# Patient Record
Sex: Male | Born: 1950
Health system: Southern US, Community
[De-identification: ages and names within clinical notes are randomized; demographics above are authoritative.]

## PROBLEM LIST (undated history)

## (undated) DIAGNOSIS — T7840XA Allergy, unspecified, initial encounter: Secondary | ICD-10-CM

## (undated) DIAGNOSIS — E785 Hyperlipidemia, unspecified: Secondary | ICD-10-CM

## (undated) DIAGNOSIS — M810 Age-related osteoporosis without current pathological fracture: Secondary | ICD-10-CM

## (undated) DIAGNOSIS — J45909 Unspecified asthma, uncomplicated: Secondary | ICD-10-CM

## (undated) DIAGNOSIS — K5790 Diverticulosis of intestine, part unspecified, without perforation or abscess without bleeding: Secondary | ICD-10-CM

## (undated) DIAGNOSIS — I10 Essential (primary) hypertension: Secondary | ICD-10-CM

## (undated) DIAGNOSIS — M1990 Unspecified osteoarthritis, unspecified site: Secondary | ICD-10-CM

## (undated) DIAGNOSIS — R972 Elevated prostate specific antigen [PSA]: Secondary | ICD-10-CM

## (undated) DIAGNOSIS — M5030 Other cervical disc degeneration, unspecified cervical region: Secondary | ICD-10-CM

## (undated) HISTORY — DX: Other cervical disc degeneration, unspecified cervical region: M50.30

## (undated) HISTORY — PX: NASAL SEPTUM SURGERY: SHX37

## (undated) HISTORY — DX: Elevated prostate specific antigen (PSA): R97.20

## (undated) HISTORY — DX: Allergy, unspecified, initial encounter: T78.40XA

## (undated) HISTORY — DX: Age-related osteoporosis without current pathological fracture: M81.0

## (undated) HISTORY — DX: Essential (primary) hypertension: I10

## (undated) HISTORY — DX: Unspecified asthma, uncomplicated: J45.909

## (undated) HISTORY — DX: Unspecified osteoarthritis, unspecified site: M19.90

## (undated) HISTORY — DX: Hyperlipidemia, unspecified: E78.5

## (undated) HISTORY — DX: Diverticulosis of intestine, part unspecified, without perforation or abscess without bleeding: K57.90

---

## 2001-03-21 ENCOUNTER — Emergency Department (HOSPITAL_COMMUNITY): Admission: EM | Admit: 2001-03-21 | Discharge: 2001-03-21 | Payer: Self-pay

## 2001-05-25 ENCOUNTER — Ambulatory Visit (HOSPITAL_COMMUNITY): Admission: RE | Admit: 2001-05-25 | Discharge: 2001-05-25 | Payer: Self-pay | Admitting: Internal Medicine

## 2004-08-05 ENCOUNTER — Ambulatory Visit: Payer: Self-pay | Admitting: Internal Medicine

## 2005-05-23 ENCOUNTER — Ambulatory Visit: Payer: Self-pay | Admitting: Gastroenterology

## 2005-05-23 ENCOUNTER — Ambulatory Visit: Payer: Self-pay | Admitting: Internal Medicine

## 2005-05-29 ENCOUNTER — Ambulatory Visit: Payer: Self-pay | Admitting: Internal Medicine

## 2005-06-02 ENCOUNTER — Ambulatory Visit: Payer: Self-pay | Admitting: Gastroenterology

## 2005-06-02 LAB — HM COLONOSCOPY: HM Colonoscopy: NORMAL

## 2005-08-26 ENCOUNTER — Ambulatory Visit: Payer: Self-pay | Admitting: Internal Medicine

## 2005-09-22 ENCOUNTER — Ambulatory Visit: Payer: Self-pay | Admitting: Internal Medicine

## 2006-06-12 ENCOUNTER — Ambulatory Visit: Payer: Self-pay | Admitting: Internal Medicine

## 2007-06-30 DIAGNOSIS — E785 Hyperlipidemia, unspecified: Secondary | ICD-10-CM

## 2007-06-30 DIAGNOSIS — I10 Essential (primary) hypertension: Secondary | ICD-10-CM

## 2007-06-30 HISTORY — DX: Hyperlipidemia, unspecified: E78.5

## 2007-06-30 HISTORY — DX: Essential (primary) hypertension: I10

## 2008-04-03 ENCOUNTER — Telehealth: Payer: Self-pay | Admitting: Internal Medicine

## 2008-04-13 ENCOUNTER — Encounter: Payer: Self-pay | Admitting: Internal Medicine

## 2008-04-13 ENCOUNTER — Telehealth: Payer: Self-pay | Admitting: Internal Medicine

## 2008-04-17 ENCOUNTER — Encounter: Payer: Self-pay | Admitting: Internal Medicine

## 2008-04-25 ENCOUNTER — Ambulatory Visit: Payer: Self-pay | Admitting: Internal Medicine

## 2008-07-17 ENCOUNTER — Telehealth: Payer: Self-pay | Admitting: Internal Medicine

## 2008-08-14 ENCOUNTER — Ambulatory Visit: Payer: Self-pay | Admitting: Internal Medicine

## 2008-08-14 DIAGNOSIS — J449 Chronic obstructive pulmonary disease, unspecified: Secondary | ICD-10-CM | POA: Insufficient documentation

## 2008-08-14 DIAGNOSIS — J4489 Other specified chronic obstructive pulmonary disease: Secondary | ICD-10-CM | POA: Insufficient documentation

## 2009-05-08 ENCOUNTER — Telehealth: Payer: Self-pay | Admitting: *Deleted

## 2009-07-06 ENCOUNTER — Ambulatory Visit: Payer: Self-pay | Admitting: Internal Medicine

## 2009-07-06 LAB — CONVERTED CEMR LAB
ALT: 29 units/L (ref 0–53)
AST: 28 units/L (ref 0–37)
Albumin: 4.6 g/dL (ref 3.5–5.2)
Alkaline Phosphatase: 60 units/L (ref 39–117)
BUN: 19 mg/dL (ref 6–23)
Basophils Absolute: 0 10*3/uL (ref 0.0–0.1)
Basophils Relative: 0 % (ref 0.0–3.0)
Bilirubin Urine: NEGATIVE
Bilirubin, Direct: 0 mg/dL (ref 0.0–0.3)
CO2: 27 meq/L (ref 19–32)
Calcium: 9.1 mg/dL (ref 8.4–10.5)
Chloride: 107 meq/L (ref 96–112)
Cholesterol: 134 mg/dL (ref 0–200)
Creatinine, Ser: 0.9 mg/dL (ref 0.4–1.5)
Direct LDL: 68.3 mg/dL
Eosinophils Absolute: 0.3 10*3/uL (ref 0.0–0.7)
Eosinophils Relative: 4.6 % (ref 0.0–5.0)
GFR calc non Af Amer: 91.44 mL/min (ref >60)
Glucose, Bld: 97 mg/dL (ref 70–99)
Glucose, Urine, Semiquant: NEGATIVE
HCT: 44.9 % (ref 39.0–52.0)
HDL: 53.4 mg/dL (ref >39.00)
Hemoglobin: 15.5 g/dL (ref 13.0–17.0)
Ketones, urine, test strip: NEGATIVE
Lymphocytes Relative: 31.7 % (ref 12.0–46.0)
Lymphs Abs: 1.9 10*3/uL (ref 0.7–4.0)
MCHC: 34.5 g/dL (ref 30.0–36.0)
MCV: 93.5 fL (ref 78.0–100.0)
Monocytes Absolute: 0.5 10*3/uL (ref 0.1–1.0)
Monocytes Relative: 9.2 % (ref 3.0–12.0)
Neutro Abs: 3.2 10*3/uL (ref 1.4–7.7)
Neutrophils Relative %: 54.5 % (ref 43.0–77.0)
Nitrite: NEGATIVE
PSA: 2.57 ng/mL (ref 0.10–4.00)
Platelets: 225 10*3/uL (ref 150.0–400.0)
Potassium: 4.2 meq/L (ref 3.5–5.1)
RBC: 4.8 M/uL (ref 4.22–5.81)
RDW: 12.5 % (ref 11.5–14.6)
Sodium: 139 meq/L (ref 135–145)
Specific Gravity, Urine: 1.02
TSH: 2.57 microintl units/mL (ref 0.35–5.50)
Total Bilirubin: 0.7 mg/dL (ref 0.3–1.2)
Total Protein: 7.1 g/dL (ref 6.0–8.3)
Urobilinogen, UA: 0.2
WBC: 5.9 10*3/uL (ref 4.5–10.5)
pH: 5.5

## 2009-07-16 ENCOUNTER — Ambulatory Visit: Payer: Self-pay | Admitting: Internal Medicine

## 2009-07-16 DIAGNOSIS — R972 Elevated prostate specific antigen [PSA]: Secondary | ICD-10-CM

## 2009-07-16 HISTORY — DX: Elevated prostate specific antigen (PSA): R97.20

## 2009-09-03 ENCOUNTER — Telehealth: Payer: Self-pay | Admitting: Internal Medicine

## 2009-10-04 ENCOUNTER — Ambulatory Visit: Payer: Self-pay | Admitting: Internal Medicine

## 2009-10-04 DIAGNOSIS — M810 Age-related osteoporosis without current pathological fracture: Secondary | ICD-10-CM

## 2009-10-04 DIAGNOSIS — M5412 Radiculopathy, cervical region: Secondary | ICD-10-CM | POA: Insufficient documentation

## 2009-10-04 DIAGNOSIS — M1990 Unspecified osteoarthritis, unspecified site: Secondary | ICD-10-CM | POA: Insufficient documentation

## 2009-10-04 HISTORY — DX: Unspecified osteoarthritis, unspecified site: M19.90

## 2009-10-04 HISTORY — DX: Age-related osteoporosis without current pathological fracture: M81.0

## 2009-10-04 LAB — CONVERTED CEMR LAB
CRP, High Sensitivity: 2.4 (ref 0.00–5.00)
Sed Rate: 10 mm/hr (ref 0–22)

## 2009-10-08 LAB — CONVERTED CEMR LAB
Anti Nuclear Antibody(ANA): NEGATIVE
C-Peptide: 1.24 ng/mL (ref 0.80–3.90)
PSA, Free Pct: 15 — ABNORMAL LOW (ref >25)
PSA, Free: 0.5 ng/mL
PSA: 3.31 ng/mL (ref 0.10–4.00)

## 2009-10-29 ENCOUNTER — Encounter: Payer: Self-pay | Admitting: Internal Medicine

## 2009-11-29 ENCOUNTER — Encounter: Payer: Self-pay | Admitting: Internal Medicine

## 2010-01-04 ENCOUNTER — Encounter: Payer: Self-pay | Admitting: Internal Medicine

## 2010-01-28 ENCOUNTER — Ambulatory Visit: Payer: Self-pay | Admitting: Internal Medicine

## 2010-01-28 LAB — CONVERTED CEMR LAB
ALT: 29 units/L (ref 0–53)
AST: 30 units/L (ref 0–37)
Albumin: 4.6 g/dL (ref 3.5–5.2)
Alkaline Phosphatase: 55 units/L (ref 39–117)
Bilirubin, Direct: 0.1 mg/dL (ref 0.0–0.3)
Cholesterol: 176 mg/dL (ref 0–200)
HDL: 63.3 mg/dL (ref >39.00)
LDL Cholesterol: 98 mg/dL (ref 0–99)
PSA, Free Pct: 15 — ABNORMAL LOW (ref >25)
PSA, Free: 0.5 ng/mL
PSA: 3.42 ng/mL (ref 0.10–4.00)
Total Bilirubin: 1.2 mg/dL (ref 0.3–1.2)
Total CHOL/HDL Ratio: 3
Total Protein: 7.2 g/dL (ref 6.0–8.3)
Triglycerides: 75 mg/dL (ref 0.0–149.0)
VLDL: 15 mg/dL (ref 0.0–40.0)

## 2010-02-04 ENCOUNTER — Ambulatory Visit: Payer: Self-pay | Admitting: Internal Medicine

## 2010-03-21 ENCOUNTER — Ambulatory Visit: Payer: Self-pay | Admitting: Internal Medicine

## 2010-03-21 DIAGNOSIS — L919 Hypertrophic disorder of the skin, unspecified: Secondary | ICD-10-CM

## 2010-03-21 DIAGNOSIS — L909 Atrophic disorder of skin, unspecified: Secondary | ICD-10-CM | POA: Insufficient documentation

## 2010-05-10 ENCOUNTER — Ambulatory Visit: Payer: Self-pay | Admitting: Internal Medicine

## 2010-05-10 LAB — CONVERTED CEMR LAB: PSA: 2.72 ng/mL (ref 0.10–4.00)

## 2010-05-16 ENCOUNTER — Telehealth: Payer: Self-pay | Admitting: Internal Medicine

## 2010-08-23 ENCOUNTER — Telehealth: Payer: Self-pay | Admitting: Internal Medicine

## 2010-08-23 ENCOUNTER — Ambulatory Visit: Payer: Self-pay | Admitting: Internal Medicine

## 2010-08-23 LAB — CONVERTED CEMR LAB
ALT: 26 units/L (ref 0–53)
AST: 25 units/L (ref 0–37)
Albumin: 4.7 g/dL (ref 3.5–5.2)
Alkaline Phosphatase: 62 units/L (ref 39–117)
BUN: 15 mg/dL (ref 6–23)
Basophils Absolute: 0 10*3/uL (ref 0.0–0.1)
Basophils Relative: 0.6 % (ref 0.0–3.0)
Bilirubin Urine: NEGATIVE
Bilirubin, Direct: 0.2 mg/dL (ref 0.0–0.3)
Blood in Urine, dipstick: NEGATIVE
CO2: 30 meq/L (ref 19–32)
Calcium: 9.5 mg/dL (ref 8.4–10.5)
Chloride: 100 meq/L (ref 96–112)
Cholesterol: 165 mg/dL (ref 0–200)
Creatinine, Ser: 0.8 mg/dL (ref 0.4–1.5)
Eosinophils Absolute: 0.3 10*3/uL (ref 0.0–0.7)
Eosinophils Relative: 4.7 % (ref 0.0–5.0)
GFR calc non Af Amer: 98.65 mL/min (ref >60)
Glucose, Bld: 98 mg/dL (ref 70–99)
Glucose, Urine, Semiquant: NEGATIVE
HCT: 46.8 % (ref 39.0–52.0)
HDL: 61.2 mg/dL (ref >39.00)
Hemoglobin: 16.1 g/dL (ref 13.0–17.0)
Ketones, urine, test strip: NEGATIVE
LDL Cholesterol: 96 mg/dL (ref 0–99)
Lymphocytes Relative: 34.9 % (ref 12.0–46.0)
Lymphs Abs: 2.2 10*3/uL (ref 0.7–4.0)
MCHC: 34.3 g/dL (ref 30.0–36.0)
MCV: 94.3 fL (ref 78.0–100.0)
Monocytes Absolute: 0.7 10*3/uL (ref 0.1–1.0)
Monocytes Relative: 10.6 % (ref 3.0–12.0)
Neutro Abs: 3.1 10*3/uL (ref 1.4–7.7)
Neutrophils Relative %: 49.2 % (ref 43.0–77.0)
Nitrite: NEGATIVE
PSA: 2.66 ng/mL (ref 0.10–4.00)
Platelets: 265 10*3/uL (ref 150.0–400.0)
Potassium: 4.6 meq/L (ref 3.5–5.1)
Protein, U semiquant: NEGATIVE
RBC: 4.96 M/uL (ref 4.22–5.81)
RDW: 13.6 % (ref 11.5–14.6)
Sodium: 139 meq/L (ref 135–145)
Specific Gravity, Urine: 1.01
TSH: 2.71 microintl units/mL (ref 0.35–5.50)
Total Bilirubin: 0.8 mg/dL (ref 0.3–1.2)
Total CHOL/HDL Ratio: 3
Total Protein: 7 g/dL (ref 6.0–8.3)
Triglycerides: 40 mg/dL (ref 0.0–149.0)
Urobilinogen, UA: 0.2
VLDL: 8 mg/dL (ref 0.0–40.0)
WBC Urine, dipstick: NEGATIVE
WBC: 6.4 10*3/uL (ref 4.5–10.5)
pH: 5.5

## 2010-09-09 ENCOUNTER — Encounter: Payer: Self-pay | Admitting: Internal Medicine

## 2010-09-12 ENCOUNTER — Ambulatory Visit: Payer: Self-pay | Admitting: Internal Medicine

## 2010-10-18 ENCOUNTER — Ambulatory Visit
Admission: RE | Admit: 2010-10-18 | Discharge: 2010-10-18 | Payer: Self-pay | Source: Home / Self Care | Attending: Internal Medicine | Admitting: Internal Medicine

## 2010-10-27 ENCOUNTER — Encounter: Payer: Self-pay | Admitting: Internal Medicine

## 2010-11-05 NOTE — Progress Notes (Signed)
Summary: Patient requesting letter  Phone Note Call from Patient   Complaint: Breathing Problems, Urinary/GYN Problems Summary of Call: Patient requesting a letter stating his various medications and what they are prescribed for. Patient states he needs letter this week. Patient can be reached at [phone]. Initial call taken by: Darra Lis RMA,  May 08, 2009 9:07 AM  Follow-up for Phone Call        done Follow-up by: Willy Eddy, LPN,  May 08, 2009 10:47 AM

## 2010-11-05 NOTE — Letter (Signed)
Summary: [facility]  [facility]   Imported By: Maryln Gottron 09/12/2010 11:19:44  _____________________________________________________________________  External Attachment:    Type:   Image     Comment:   External Document

## 2010-11-05 NOTE — Progress Notes (Signed)
Summary: cough  Phone Note Refill Request   Caller: Patient Call For: Dr. Lovell Sheehan Summary of Call: Pt is coughing x 3 days from his seasonal allergies.  No SOB. Thinks a cough med with codeine would help.  No fever. No other complaints. [phone] Initial call taken by: Lynann Beaver CMA,  July 17, 2008 8:57 AM  Follow-up for Phone Call        call in hydromet on his med list already.....   Follow-up by: Stacie Glaze MD,  July 17, 2008 2:44 PM    New/Updated Medications: HYDROMET 5-1.5 MG/5ML SYRP (HYDROCODONE-HOMATROPINE) two tsp by mouth q 8 hours   Prescriptions: HYDROMET 5-1.5 MG/5ML SYRP (HYDROCODONE-HOMATROPINE) two tsp by mouth q 8 hours  #6 oz. x 0   Entered by:   Lynann Beaver CMA   Authorized by:   Stacie Glaze MD   Signed by:   Lynann Beaver CMA on 07/17/2008   Method used:   Telephoned to ...       [facility]  [place] [address] (retail)       [address]       [place], [place]  [address]       Ph: [phone] or [phone]       Fax: [phone]   RxID:   [id number]

## 2010-11-05 NOTE — Progress Notes (Signed)
Summary: Fax lab order request  Phone Note Call from Patient Call back at [phone] (cell)   Caller: Patient Call For: Lovell Sheehan Reason for Call: Lab or Test Results Summary of Call: Pt has CPX scheduled for next week.  Pt will be in [place] until then and needs to have his labs done there Please fax order for CBC, Lipid panel, Liver panel and CMP to [phone] to pt attention Initial call taken by: Sid Falcon LPN,  April 13, 3473 2:53 PM  Follow-up for Phone Call        done Follow-up by: Willy Eddy, LPN,  April 13, 2594 3:12 PM

## 2010-11-05 NOTE — Progress Notes (Signed)
Summary: written and call in rx  Phone Note Call from Patient Call back at Home Phone [phone]   Caller: Patient Call For: dr Lovell Sheehan Summary of Call: pt would like mailorder rx for lotrel and vytorin also [facility] day [facility] of both meds. Initial call taken by: Heron Sabins,  April 03, 2008 11:22 AM      Prescriptions: VYTORIN 10-40 MG  TABS (EZETIMIBE-SIMVASTATIN) Take 1 tablet by mouth once a day  #30 x 0   Entered by:   Willy Eddy, LPN   Authorized by:   Stacie Glaze MD   Signed by:   Willy Eddy, LPN on 62/13/0865   Method used:   Electronically sent to ...       [facility]  [place] [address]       [address]       [place], [place]  [address]       Ph: [phone] or [phone]       Fax: [phone]   RxID:   [id number] LOTREL 5-10 MG  CAPS (AMLODIPINE BESY-BENAZEPRIL HCL)   #30 x 0   Entered by:   Willy Eddy, LPN   Authorized by:   Stacie Glaze MD   Signed by:   Willy Eddy, LPN on 88/41/6606   Method used:   Electronically sent to ...       [facility]  [place] [address]       [address]       [place], [place]  [address]       Ph: [phone] or [phone]       Fax: [phone]   RxID:   [id number]

## 2010-11-05 NOTE — Letter (Signed)
Summary: [facility]  [facility]   Imported By: Maryln Gottron 12/04/2009 10:02:54  _____________________________________________________________________  External Attachment:    Type:   Image     Comment:   External Document

## 2010-11-05 NOTE — Assessment & Plan Note (Signed)
Summary: CPX/RCD   Vital Signs:  Patient profile:   60 year old male Height:      69 inches Weight:      189 pounds BMI:     28.01 Temp:     98.2 degrees F oral Pulse rate:   84 / minute Resp:     12 per minute BP sitting:   140 / 80  (left arm)  Vitals Entered By: Willy Eddy, LPN (July 16, 2009 3:03 PM)  CC:  cpx.  History of Present Illness: The pt was asked about all immunizations, health maint. services that are appropriate to their age and was given guidance on diet exercize  and weight management slight increase in the PSA of about 25% over  18 months  Problems Prior to Update: 1)  Chronic Obstructive Asthma Unspecified  (ICD-493.20) 2)  Hypertension  (ICD-401.9) 3)  Hyperlipidemia  (ICD-272.4)  Medications Prior to Update: 1)  Lotrel 5-10 Mg  Caps (Amlodipine Besy-Benazepril Hcl) .Marland Kitchen.. 1 Once Daily- No More Without Ov 2)  Astelin 137 Mcg/spray  Soln (Azelastine Hcl) .... As Needed 3)  Glucosamine Hcl 1000 Mg  Tabs (Glucosamine Hcl) .... As Needed 4)  Vytorin 10-40 Mg  Tabs (Ezetimibe-Simvastatin) .... Take 1 Tablet By Mouth Once A Day 5)  Advair Diskus 250-50 Mcg/dose  Misc (Fluticasone-Salmeterol) .... As Needed 6)  Hydromet 5-1.5 Mg/54ml Syrp (Hydrocodone-Homatropine) .... Two Tsp By Mouth Q 8 Hours  Current Medications (verified): 1)  Lotrel 5-10 Mg  Caps (Amlodipine Besy-Benazepril Hcl) .Marland Kitchen.. 1 Once Daily- No More Without Ov 2)  Astelin 137 Mcg/spray  Soln (Azelastine Hcl) .... As Needed 3)  Glucosamine Hcl 1000 Mg  Tabs (Glucosamine Hcl) .... As Needed 4)  Vytorin 10-40 Mg  Tabs (Ezetimibe-Simvastatin) .... Take 1 Tablet By Mouth Once A Day 5)  Advair Diskus 250-50 Mcg/dose  Misc (Fluticasone-Salmeterol) .... As Needed 6)  Allegra 180 Mg Tabs (Fexofenadine Hcl) .Marland Kitchen.. 1 Once Daily  Allergies (verified): No Known Drug Allergies  Past History:  Past medical, surgical, family and social histories (including risk factors) reviewed, and no changes noted  (except as noted below).  Past Medical History: Reviewed history from 06/30/2007 and no changes required. Hyperlipidemia Hypertension  Family History: Reviewed history and no changes required.  Social History: Reviewed history and no changes required.  Review of Systems  The patient denies anorexia, fever, weight loss, weight gain, vision loss, decreased hearing, hoarseness, chest pain, syncope, dyspnea on exertion, peripheral edema, prolonged cough, headaches, hemoptysis, abdominal pain, melena, hematochezia, severe indigestion/heartburn, hematuria, incontinence, genital sores, muscle weakness, suspicious skin lesions, transient blindness, difficulty walking, depression, unusual weight change, abnormal bleeding, enlarged lymph nodes, angioedema, and breast masses.    Physical Exam  General:  Well-developed,well-nourished,in no acute distress; alert,appropriate and cooperative throughout examination Head:  normocephalic and male-pattern balding.   Eyes:  No corneal or conjunctival inflammation noted. EOMI. Perrla. Funduscopic exam benign, without hemorrhages, exudates or papilledema. Vision grossly normal. Ears:  R ear normal and L ear normal.   Nose:  External nasal examination shows no deformity or inflammation. Nasal mucosa are pink and moist without lesions or exudates. Mouth:  Oral mucosa and oropharynx without lesions or exudates.  Teeth in good repair. Neck:  No deformities, masses, or tenderness noted. Chest Wall:  No deformities, masses, tenderness or gynecomastia noted. Lungs:  Normal respiratory effort, chest expands symmetrically. Lungs are clear to auscultation, no crackles or wheezes. Heart:  Normal rate and regular rhythm. S1 and S2 normal without  gallop, murmur, click, rub or other extra sounds. Abdomen:  Bowel sounds positive,abdomen soft and non-tender without masses, organomegaly or hernias noted. Rectal:  No external abnormalities noted. Normal sphincter tone. No  rectal masses or tenderness. Prostate:  Prostate gland firm and smooth, no enlargement, nodularity, tenderness, mass, asymmetry or induration. Msk:  No deformity or scoliosis noted of thoracic or lumbar spine.   Pulses:  R and L carotid,radial,femoral,dorsalis pedis and posterior tibial pulses are full and equal bilaterally Extremities:  No clubbing, cyanosis, edema, or deformity noted with normal full range of motion of all joints.   Neurologic:  No cranial nerve deficits noted. Station and gait are normal. Plantar reflexes are down-going bilaterally. DTRs are symmetrical throughout. Sensory, motor and coordinative functions appear intact.   Impression & Recommendations:  Problem # 1:  PREVENTIVE HEALTH CARE (ICD-V70.0) The pt was asked about all immunizations, health maint. services that are appropriate to their age and was given guidance on diet exercize  and weight management  Colonoscopy: normal (05/27/2005) Td Booster: Td (10/06/2001)   Flu Vax: Fluvax 3+ (07/16/2009)   Chol: 134 (07/06/2009)   HDL: 53.40 (07/06/2009)   TSH: 2.57 (07/06/2009)   PSA: 2.57 (07/06/2009) Next Colonoscopy due:: 06/2015 (07/16/2009)  Discussed using sunscreen, use of alcohol, drug use, self testicular exam, routine dental care, routine eye care, routine physical exam, seat belts, multiple vitamins, osteoporosis prevention, adequate calcium intake in diet, and recommendations for immunizations.  Discussed exercise and checking cholesterol.  Discussed gun safety, safe sex, and contraception. Also recommend checking PSA.  Problem # 2:  CHRONIC OBSTRUCTIVE ASTHMA UNSPECIFIED (ICD-493.20) stable His updated medication list for this problem includes:    Advair Diskus 250-50 Mcg/dose Misc (Fluticasone-salmeterol) .Marland Kitchen... As needed  Problem # 3:  HYPERTENSION (ICD-401.9)  His updated medication list for this problem includes:    Lotrel 5-10 Mg Caps (Amlodipine besy-benazepril hcl) .Marland Kitchen... 1 once daily- no more without  ov  Orders: EKG w/ Interpretation (93000)  NSR  BP today: 140/80 Prior BP: 130/80 (08/14/2008)  Prior 10 Yr Risk Heart Disease: Not enough information (08/14/2008)  Labs Reviewed: K+: 4.2 (07/06/2009) Creat: : 0.9 (07/06/2009)   Chol: 134 (07/06/2009)   HDL: 53.40 (07/06/2009)     Complete Medication List: 1)  Lotrel 5-10 Mg Caps (Amlodipine besy-benazepril hcl) .Marland Kitchen.. 1 once daily- no more without ov 2)  Astelin 137 Mcg/spray Soln (Azelastine hcl) .... As needed 3)  Glucosamine Hcl 1000 Mg Tabs (Glucosamine hcl) .... As needed 4)  Vytorin 10-40 Mg Tabs (Ezetimibe-simvastatin) .... Take 1 tablet by mouth once a day 5)  Advair Diskus 250-50 Mcg/dose Misc (Fluticasone-salmeterol) .... As needed 6)  Allegra 180 Mg Tabs (Fexofenadine hcl) .Marland Kitchen.. 1 once daily  Other Orders: Admin 1st Vaccine ([address]) Flu Vaccine 53yrs + ([address]454)  Patient Instructions: 1)  PSA and free PSA in 6 months 790.93 2)  Hepatic Panel prior to visit, ICD-9:995.20 3)  Lipid Panel prior to visit, ICD-9:272.4 4)  Please schedule a follow-up appointment in 6 months.   Preventive Care Screening  Colonoscopy:    Date:  05/27/2005    Next Due:  06/2015    Results:  normal      Flu Vaccine Consent Questions     Do you have a history of severe allergic reactions to this vaccine? no    Any prior history of allergic reactions to egg and/or gelatin? no    Do you have a sensitivity to the preservative Thimersol? no    Do you have  a past history of Guillan-Barre Syndrome? no    Do you currently have an acute febrile illness? no    Have you ever had a severe reaction to latex? no    Vaccine information given and explained to patient? yes    Are you currently pregnant? no    Lot Number:[id number]   Exp Date:04/04/2010   Site Given  Left Deltoid IM       Appended Document: CPX/RCD scripts ready for pick up

## 2010-11-05 NOTE — Op Note (Signed)
Summary: Ultrasound and Biopsy of the Prostate/[facility]  Ultrasound and Biopsy of the Prostate/[facility]   Imported By: Maryln Gottron 01/11/2010 15:45:59  _____________________________________________________________________  External Attachment:    Type:   Image     Comment:   External Document

## 2010-11-05 NOTE — Progress Notes (Signed)
Summary: [facility] AND NEED AN APPT  Phone Note Call from Patient Call back at Home Phone [phone]   Caller: Patient-LIVE CALL Summary of Call: WAS GIVEN LOTREL, VYTORIN AND ADVAIR. PLEASE REWRITE DISPENSE AS WRITTEN FOR MAIL ORDER. CALL PT WHEN READY FOR PICK UP.  WANTS FUP APPT WITH DR Lovell Sheehan IN THE NEXT 2 WEEKS.  Initial call taken by: Warnell Forester,  September 03, 2009 1:03 PM    Prescriptions: ADVAIR DISKUS 250-50 MCG/DOSE  MISC (FLUTICASONE-SALMETEROL) as needed  #3 units x 3   Entered by:   Willy Eddy, LPN   Authorized by:   Stacie Glaze MD   Signed by:   Willy Eddy, LPN on 29/56/2130   Method used:   Print then Give to Patient   RxID:   [id number] VYTORIN 10-40 MG  TABS (EZETIMIBE-SIMVASTATIN) Take 1 tablet by mouth once a day  #90 x 3   Entered by:   Willy Eddy, LPN   Authorized by:   Stacie Glaze MD   Signed by:   Willy Eddy, LPN on 32/44/0102   Method used:   Print then Give to Patient   RxID:   [id number] LOTREL 5-10 MG  CAPS (AMLODIPINE BESY-BENAZEPRIL HCL) 1 once daily- no more without ov  #90 x 3   Entered by:   Willy Eddy, LPN   Authorized by:   Stacie Glaze MD   Signed by:   Willy Eddy, LPN on 56/38/7564   Method used:   Print then Give to Patient   RxID:   [id number]

## 2010-11-05 NOTE — Consult Note (Signed)
Summary: [facility]  [facility]   Imported By: Maryln Gottron 11/05/2009 11:21:38  _____________________________________________________________________  External Attachment:    Type:   Image     Comment:   External Document

## 2010-11-05 NOTE — Assessment & Plan Note (Signed)
Summary: Ian Burns/bmw   Vital Signs:  Patient profile:   60 year old male Height:      69 inches Weight:      188 pounds BMI:     27.86 Temp:     98.2 degrees F oral Pulse rate:   84 / minute Resp:     14 per minute BP sitting:   122 / 80  (left arm)  Vitals Entered By: Willy Eddy, LPN (October 04, 2009 8:38 AM) CC: Ian Burns   CC:  Ian Burns.  History of Present Illness: REVEIW OF PROSTATE ISSUES AND THE PSA FROM THE PHYSICAL THE PTS HAS SEVERE NECK PAIN WITH PROBABLE RADICULOPATY BUT DUE TO SEVERE CLOSTROPHOBIA WE HAVE BEEN UNABLE TO GET AN MRI we discussed the use of versed in a hospital setting for the mri the pt also noted increased arthristis, morning stiffness and low back pain that inmproves with activity the pt has mild swelling of hand, and report no warmth, or red ness of the joints  I have spent greater that 45 min face to face evaluating this patient and over 1/2 of this time was in councilling ( total time)  Problems Prior to Update: 1)  Prostate Specific Antigen, Elevated  (ICD-790.93) 2)  Preventive Health Care  (ICD-V70.0) 3)  Chronic Obstructive Asthma Unspecified  (ICD-493.20) 4)  Hypertension  (ICD-401.9) 5)  Hyperlipidemia  (ICD-272.4)  Medications Prior to Update: 1)  Lotrel 5-10 Mg  Caps (Amlodipine Besy-Benazepril Hcl) .Marland Kitchen.. 1 Once Daily- No More Without Ov 2)  Astelin 137 Mcg/spray  Soln (Azelastine Hcl) .... As Needed 3)  Glucosamine Hcl 1000 Mg  Tabs (Glucosamine Hcl) .... As Needed 4)  Vytorin 10-40 Mg  Tabs (Ezetimibe-Simvastatin) .... Take 1 Tablet By Mouth Once A Day 5)  Advair Diskus 250-50 Mcg/dose  Misc (Fluticasone-Salmeterol) .... As Needed 6)  Allegra 180 Mg Tabs (Fexofenadine Hcl) .Marland Kitchen.. 1 Once Daily  Current Medications (verified): 1)  Lotrel 5-10 Mg  Caps (Amlodipine Besy-Benazepril Hcl) .Marland Kitchen.. 1 Once Daily- No More Without Ov 2)  Astelin 137 Mcg/spray  Soln (Azelastine Hcl) .... As Needed 3)  Glucosamine Hcl 1000 Mg  Tabs (Glucosamine Hcl)  .... As Needed 4)  Vytorin 10-40 Mg  Tabs (Ezetimibe-Simvastatin) .... Take 1 Tablet By Mouth Once A Day 5)  Advair Diskus 250-50 Mcg/dose  Misc (Fluticasone-Salmeterol) .... As Needed 6)  Allegra 180 Mg Tabs (Fexofenadine Hcl) .Marland Kitchen.. 1 Once Daily  Allergies (verified): No Known Drug Allergies  Past History:  Past medical, surgical, family and social histories (including risk factors) reviewed, and no changes noted (except as noted below).  Past Medical History: Reviewed history from 06/30/2007 and no changes required. Hyperlipidemia Hypertension  Family History: Reviewed history and no changes required. no family hx of prostate cnacer  Social History: Reviewed history and no changes required.  Review of Systems  The patient denies anorexia, fever, weight loss, weight gain, vision loss, decreased hearing, hoarseness, chest pain, syncope, dyspnea on exertion, peripheral edema, prolonged cough, headaches, hemoptysis, abdominal pain, melena, hematochezia, severe indigestion/heartburn, hematuria, incontinence, genital sores, muscle weakness, suspicious skin lesions, transient blindness, difficulty walking, depression, unusual weight change, abnormal bleeding, enlarged lymph nodes, angioedema, breast masses, and testicular masses.    Physical Exam  General:  Well-developed,well-nourished,in no acute distress; alert,appropriate and cooperative throughout examination Head:  normocephalic and male-pattern balding.   Eyes:  pupils equal, pupils round, and pupils reactive to light.   Ears:  R ear normal and L ear normal.  R ear normal  and L ear normal.   Nose:  External nasal examination shows no deformity or inflammation. Nasal mucosa are pink and moist without lesions or exudates. Neck:  No deformities, masses, or tenderness noted. Lungs:  Normal respiratory effort, chest expands symmetrically. Lungs are clear to auscultation, no crackles or wheezes. Heart:  Normal rate and regular rhythm.  S1 and S2 normal without gallop, murmur, click, rub or other extra sounds. Abdomen:  Bowel sounds positive,abdomen soft and non-tender without masses, organomegaly or hernias noted. Msk:  No deformity or scoliosis noted of thoracic or lumbar spine.   Pulses:  R and L carotid,radial,femoral,dorsalis pedis and posterior tibial pulses are full and equal bilaterally Extremities:  No clubbing, cyanosis, edema, or deformity noted with normal full range of motion of all joints.   Neurologic:  No cranial nerve deficits noted. Station and gait are normal. Plantar reflexes are down-going bilaterally. DTRs are symmetrical throughout. Sensory, motor and coordinative functions appear intact. Skin:  turgor normal and color normal.   Cervical Nodes:  No lymphadenopathy noted Axillary Nodes:  No palpable lymphadenopathy Psych:  Oriented X3 and not anxious appearing.     Impression & Recommendations:  Problem # 1:  OSTEOARTHRITIS, MODERATE (ICD-715.90)  monitering labs Discussed use of medications, application of heat or cold, and exercises.   Orders: Venipuncture ([address]) TLB-CRP-High Sensitivity (C-Reactive Protein) (86140-FCRP) TLB-Sedimentation Rate (ESR) (85652-ESR) T-Antinuclear Antib (ANA) [phone]) T- * Misc. Laboratory test [phone])  Problem # 2:  PROSTATE SPECIFIC ANTIGEN, ELEVATED (ICD-790.93) discussed the BPH vs concern over cnacer hesitancy in the AM with decreased flow I have spent greater that 30 min face to face evaluating this patient over 1/2 was councilling  Problem # 3:  CHRONIC OBSTRUCTIVE ASTHMA UNSPECIFIED (ICD-493.20) peak flow was 400 reproducible and the use of the inhailer has been variable His updated medication list for this problem includes:    Advair Diskus 250-50 Mcg/dose Misc (Fluticasone-salmeterol) .Marland Kitchen..Marland Kitchen Two times a day I have spent greater that 30 min face to face evaluating this patient over 1/2 was councilling  Problem # 4:  HYPERTENSION  (ICD-401.9) stable His updated medication list for this problem includes:    Lotrel 5-10 Mg Caps (Amlodipine besy-benazepril hcl) .Marland Kitchen... 1 once daily- no more without ov  BP today: 122/80 Prior BP: 140/80 (07/16/2009)  Prior 10 Yr Risk Heart Disease: Not enough information (08/14/2008)  Labs Reviewed: K+: 4.2 (07/06/2009) Creat: : 0.9 (07/06/2009)   Chol: 134 (07/06/2009)   HDL: 53.40 (07/06/2009)     Complete Medication List: 1)  Lotrel 5-10 Mg Caps (Amlodipine besy-benazepril hcl) .Marland Kitchen.. 1 once daily- no more without ov 2)  Astelin 137 Mcg/spray Soln (Azelastine hcl) .... As needed 3)  Glucosamine Hcl 1000 Mg Tabs (Glucosamine hcl) .... As needed 4)  Vytorin 10-40 Mg Tabs (Ezetimibe-simvastatin) .... Take 1 tablet by mouth once a day 5)  Advair Diskus 250-50 Mcg/dose Misc (Fluticasone-salmeterol) .... Two times a day 6)  Allegra 180 Mg Tabs (Fexofenadine hcl) .Marland Kitchen.. 1 once daily  Other Orders: T-PSA Total ([address]) T-PSA Free ([address])  Patient Instructions: 1)  increased the advair to two times a day for the next 30 days  Appended Document: Orders Update    Clinical Lists Changes  Problems: Added new problem of CERVICAL RADICULOPATHY (ICD-723.4) Orders: Added new Referral order of Radiology Referral (Radiology) - Signed      Appended Document: Ian Burns/bmw     History of Present Illness: addendum to todays visit: Pt rides horses for sport ( dessage) and noted pain  radiating down next arm this was fisrt docummented in 2007 with plane films since then the pain has been intermintnantly severe ranging from a 3/10 to a 8/10 based on activity he notes numbness down the left arm asan afterthougt today he states that he is finnaly ready to find outwhat is causing this but due to his signficant claustraphobia he has refused MRIS in the past. Due to the nature of his symptoms and the lenght of the pain as wel as the suggestion of c4-5 dz on plain films an MRI is  warrented.  Allergies: No Known Drug Allergies   Impression & Recommendations:  Problem # 1:  CERVICAL RADICULOPATHY (ICD-723.4) mri of cervicla spine with versed due to claustraphobia  Complete Medication List: 1)  Lotrel 5-10 Mg Caps (Amlodipine besy-benazepril hcl) .Marland Kitchen.. 1 once daily- no more without ov 2)  Astelin 137 Mcg/spray Soln (Azelastine hcl) .... As needed 3)  Glucosamine Hcl 1000 Mg Tabs (Glucosamine hcl) .... As needed 4)  Vytorin 10-40 Mg Tabs (Ezetimibe-simvastatin) .... Take 1 tablet by mouth once a day 5)  Advair Diskus 250-50 Mcg/dose Misc (Fluticasone-salmeterol) .... Two times a day 6)  Allegra 180 Mg Tabs (Fexofenadine hcl) .Marland Kitchen.. 1 once daily

## 2010-11-05 NOTE — Progress Notes (Signed)
Summary: PSA  Phone Note Call from Patient   Caller: Patient Call For: Stacie Glaze MD Reason for Call: Acute Illness Summary of Call: Pt is asking for PSA results. [phone] Initial call taken by: Lynann Beaver CMA,  May 16, 2010 2:49 PM  Follow-up for Phone Call        left message on machine  Follow-up by: Willy Eddy, LPN,  May 17, 2010 8:54 AM

## 2010-11-05 NOTE — Assessment & Plan Note (Signed)
Summary: discuss bloodwork/mhf   Vital Signs:  Patient Profile:   60 Years Old Male Weight:      187 pounds Temp:     98.5 degrees F oral Pulse rate:   76 / minute Resp:     14 per minute BP sitting:   130 / 80  (left arm)  Vitals Entered By: Willy Eddy, LPN (August 14, 2008 4:05 PM)                 Chief Complaint:  roa.  History of Present Illness: Current Problems:  HYPERTENSION (ICD-401.9) HYPERLIPIDEMIA (ICD-272.4)    Hypertension Follow-Up      This is a 60 year old man who presents for Hypertension follow-up.  blood pressure at drug store 118/90.  The patient denies lightheadedness, urinary frequency, headaches, edema, impotence, rash, and fatigue.  The patient denies the following associated symptoms: chest pain, chest pressure, exercise intolerance, dyspnea, palpitations, syncope, leg edema, and pedal edema.  Compliance with medications (by patient report) has been near 100%.  The patient reports that dietary compliance has been excellent.  The patient reports exercising daily.    Hypertension History:      He denies headache, chest pain, palpitations, dyspnea with exertion, orthopnea, PND, peripheral edema, visual symptoms, neurologic problems, syncope, and side effects from treatment.        Positive major cardiovascular risk factors include male age 51 years old or older, hyperlipidemia, and hypertension.       Prior Medication List:  LOTREL 5-10 MG  CAPS (AMLODIPINE BESY-BENAZEPRIL HCL) 1 once daily- no more without ov ASTELIN 137 MCG/SPRAY  SOLN (AZELASTINE HCL) as needed GLUCOSAMINE HCL 1000 MG  TABS (GLUCOSAMINE HCL) as needed VYTORIN 10-40 MG  TABS (EZETIMIBE-SIMVASTATIN) Take 1 tablet by mouth once a day ADVAIR DISKUS 250-50 MCG/DOSE  MISC (FLUTICASONE-SALMETEROL) as needed HYDROMET 5-1.5 MG/5ML SYRP (HYDROCODONE-HOMATROPINE) two tsp by mouth q 8 hours   Current Allergies (reviewed today): No known allergies   Past Medical History:  Reviewed history from 06/30/2007 and no changes required:       Hyperlipidemia       Hypertension   Family History:    Reviewed history and no changes required:  Social History:    Reviewed history and no changes required:    Review of Systems  The patient denies anorexia, fever, weight loss, weight gain, vision loss, decreased hearing, hoarseness, chest pain, syncope, dyspnea on exertion, peripheral edema, prolonged cough, headaches, hemoptysis, abdominal pain, melena, hematochezia, severe indigestion/heartburn, hematuria, incontinence, genital sores, muscle weakness, suspicious skin lesions, transient blindness, difficulty walking, depression, unusual weight change, abnormal bleeding, enlarged lymph nodes, angioedema, and breast masses.     Physical Exam  General:     Well-developed,well-nourished,in no acute distress; alert,appropriate and cooperative throughout examination Eyes:     No corneal or conjunctival inflammation noted. EOMI. Perrla. Funduscopic exam benign, without hemorrhages, exudates or papilledema. Vision grossly normal. Nose:     External nasal examination shows no deformity or inflammation. Nasal mucosa are pink and moist without lesions or exudates. Mouth:     Oral mucosa and oropharynx without lesions or exudates.  Teeth in good repair. Neck:     No deformities, masses, or tenderness noted. Lungs:     Normal respiratory effort, chest expands symmetrically. Lungs are clear to auscultation, no crackles or wheezes. Heart:     Normal rate and regular rhythm. S1 and S2 normal without gallop, murmur, click, rub or other extra sounds. Abdomen:  Bowel sounds positive,abdomen soft and non-tender without masses, organomegaly or hernias noted. Rectal:     No external abnormalities noted. Normal sphincter tone. No rectal masses or tenderness. Prostate:     Prostate gland firm and smooth, no enlargement, nodularity, tenderness, mass, asymmetry or induration. Msk:      No deformity or scoliosis noted of thoracic or lumbar spine.   Pulses:     R and L carotid,radial,femoral,dorsalis pedis and posterior tibial pulses are full and equal bilaterally Neurologic:     No cranial nerve deficits noted. Station and gait are normal. Plantar reflexes are down-going bilaterally. DTRs are symmetrical throughout. Sensory, motor and coordinative functions appear intact.    Impression & Recommendations:  Problem # 1:  HYPERTENSION (ICD-401.9)  His updated medication list for this problem includes:    Lotrel 5-10 Mg Caps (Amlodipine besy-benazepril hcl) .Marland Kitchen... 1 once daily- no more without ov  BP today: 150/90   Problem # 2:  HYPERLIPIDEMIA (ICD-272.4)  His updated medication list for this problem includes:    Vytorin 10-40 Mg Tabs (Ezetimibe-simvastatin) .Marland Kitchen... Take 1 tablet by mouth once a day   Problem # 3:  HYPERLIPIDEMIA (ICD-272.4)  His updated medication list for this problem includes:    Vytorin 10-40 Mg Tabs (Ezetimibe-simvastatin) .Marland Kitchen... Take 1 tablet by mouth once a day   Problem # 4:  CHRONIC OBSTRUCTIVE ASTHMA UNSPECIFIED (ICD-493.20)  His updated medication list for this problem includes:    Advair Diskus 250-50 Mcg/dose Misc (Fluticasone-salmeterol) .Marland Kitchen... As needed   Complete Medication List: 1)  Lotrel 5-10 Mg Caps (Amlodipine besy-benazepril hcl) .Marland Kitchen.. 1 once daily- no more without ov 2)  Astelin 137 Mcg/spray Soln (Azelastine hcl) .... As needed 3)  Glucosamine Hcl 1000 Mg Tabs (Glucosamine hcl) .... As needed 4)  Vytorin 10-40 Mg Tabs (Ezetimibe-simvastatin) .... Take 1 tablet by mouth once a day 5)  Advair Diskus 250-50 Mcg/dose Misc (Fluticasone-salmeterol) .... As needed 6)  Hydromet 5-1.5 Mg/18ml Syrp (Hydrocodone-homatropine) .... Two tsp by mouth q 8 hours  Hypertension Assessment/Plan:      The patient's hypertensive risk group is category B: At least one risk factor (excluding diabetes) with no target organ damage.  Today's blood  pressure is 130/80.  His blood pressure goal is < 140/90.   Patient Instructions: 1)  PRN   Prescriptions: ADVAIR DISKUS 250-50 MCG/DOSE  MISC (FLUTICASONE-SALMETEROL) as needed  #3 units x 3   Entered and Authorized by:   Stacie Glaze MD   Signed by:   Stacie Glaze MD on 08/14/2008   Method used:   Print then Give to Patient   RxID:   [id number] VYTORIN 10-40 MG  TABS (EZETIMIBE-SIMVASTATIN) Take 1 tablet by mouth once a day  #90 x 3   Entered and Authorized by:   Stacie Glaze MD   Signed by:   Stacie Glaze MD on 08/14/2008   Method used:   Print then Give to Patient   RxID:   [id number] LOTREL 5-10 MG  CAPS (AMLODIPINE BESY-BENAZEPRIL HCL) 1 once daily- no more without ov  #90 x 3   Entered and Authorized by:   Stacie Glaze MD   Signed by:   Stacie Glaze MD on 08/14/2008   Method used:   Print then Give to Patient   RxID:   [id number]  ]

## 2010-11-05 NOTE — Progress Notes (Signed)
Summary: needs refills for locally and for mail order  Phone Note Refill Request Call back at Home Phone [phone] Message from:  Patient---walk in  Refills Requested: Medication #1:  LOTREL 5-10 MG  CAPS 1 once daily- no more without ov  Medication #2:  VYTORIN 10-40 MG  TABS Take 1 tablet by mouth once a day [facility] . also need these 2 written out for mail order. he will pick up scripts. please call him when ready.  Initial call taken by: Warnell Forester,  August 23, 2010 8:42 AM  Follow-up for Phone Call        please let pt know 30 days scripts sent to [facility] and written scripts are ready for pick up Follow-up by: Willy Eddy, LPN,  August 23, 2010 8:52 AM    Prescriptions: VYTORIN 10-40 MG  TABS (EZETIMIBE-SIMVASTATIN) Take 1 tablet by mouth once a day  #30 x 0   Entered by:   Willy Eddy, LPN   Authorized by:   Stacie Glaze MD   Signed by:   Willy Eddy, LPN on 56/38/7564   Method used:   [facility]ically to        [facility] [address] [facility]       [address]       [place], [place]  [address]       Ph: [phone] or [phone]       Fax: [phone]   RxID:   [id number] LOTREL 5-10 MG  CAPS (AMLODIPINE BESY-BENAZEPRIL HCL) 1 once daily- no more without ov  #30 x 0   Entered by:   Willy Eddy, LPN   Authorized by:   Stacie Glaze MD   Signed by:   Willy Eddy, LPN on 16/04/3709   Method used:   [facility]        [facility]  [place] [address]       [address]       [place], [place]  [address]       Ph: [phone] or [phone]       Fax: [phone]   RxID:   [id number] VYTORIN 10-40 MG  TABS (EZETIMIBE-SIMVASTATIN) Take 1 tablet by mouth once a day  #90 x 3   Entered by:   Willy Eddy, LPN   Authorized by:   Stacie Glaze MD   Signed by:   Willy Eddy, LPN on 42/35/3614   Method used:   Print then Give to Patient   RxID:   [id number] LOTREL 5-10  MG  CAPS (AMLODIPINE BESY-BENAZEPRIL HCL) 1 once daily- no more without ov  #90 x 3   Entered by:   Willy Eddy, LPN   Authorized by:   Stacie Glaze MD   Signed by:   Willy Eddy, LPN on 32/67/1245   Method used:   Print then Give to Patient   RxID:   [id number]

## 2010-11-05 NOTE — Assessment & Plan Note (Signed)
Summary: 6 MONTH ROV/NJR/pt rescd from bump//[facility] rsc bmp/njr   Vital Signs:  Patient profile:   60 year old male Height:      69 inches Weight:      188 pounds BMI:     27.86 Temp:     98.2 degrees F oral Pulse rate:   72 / minute Resp:     14 per minute BP sitting:   136 / 80  (left arm)  Vitals Entered By: Willy Eddy, LPN (Feb 04, 1609 12:09 PM) CC: roa-labs   CC:  roa-labs.  History of Present Illness:  Hyperlipidemia Follow-Up      This is a 60 year old man who presents for Hyperlipidemia follow-up.  The patient denies muscle aches, GI upset, abdominal pain, flushing, itching, constipation, diarrhea, and fatigue.  The patient denies the following symptoms: chest pain/pressure, exercise intolerance, dypsnea, palpitations, syncope, and pedal edema.  Compliance with medications (by patient report) has been near 100%.  Dietary compliance has been excellent.  The patient reports exercising daily.   presents fporconference with wife about PSA and urological intervention palnned based on biopsy results I have spent greater that 45 min face to face evaluating this patient and over 1/2 of this time was in councilling   Problems Prior to Update: 1)  Cervical Radiculopathy  (ICD-723.4) 2)  Unspecified Osteoporosis  (ICD-733.00) 3)  Osteoarthritis, Moderate  (ICD-715.90) 4)  Prostate Specific Antigen, Elevated  (ICD-790.93) 5)  Preventive Health Care  (ICD-V70.0) 6)  Chronic Obstructive Asthma Unspecified  (ICD-493.20) 7)  Hypertension  (ICD-401.9) 8)  Hyperlipidemia  (ICD-272.4)  Current Problems (verified): 1)  Cervical Radiculopathy  (ICD-723.4) 2)  Unspecified Osteoporosis  (ICD-733.00) 3)  Osteoarthritis, Moderate  (ICD-715.90) 4)  Prostate Specific Antigen, Elevated  (ICD-790.93) 5)  Preventive Health Care  (ICD-V70.0) 6)  Chronic Obstructive Asthma Unspecified  (ICD-493.20) 7)  Hypertension  (ICD-401.9) 8)  Hyperlipidemia  (ICD-272.4)  Medications Prior to  Update: 1)  Lotrel 5-10 Mg  Caps (Amlodipine Besy-Benazepril Hcl) .Marland Kitchen.. 1 Once Daily- No More Without Ov 2)  Astelin 137 Mcg/spray  Soln (Azelastine Hcl) .... As Needed 3)  Glucosamine Hcl 1000 Mg  Tabs (Glucosamine Hcl) .... As Needed 4)  Vytorin 10-40 Mg  Tabs (Ezetimibe-Simvastatin) .... Take 1 Tablet By Mouth Once A Day 5)  Advair Diskus 250-50 Mcg/dose  Misc (Fluticasone-Salmeterol) .... Two Times A Day 6)  Allegra 180 Mg Tabs (Fexofenadine Hcl) .Marland Kitchen.. 1 Once Daily  Current Medications (verified): 1)  Lotrel 5-10 Mg  Caps (Amlodipine Besy-Benazepril Hcl) .Marland Kitchen.. 1 Once Daily- No More Without Ov 2)  Astelin 137 Mcg/spray  Soln (Azelastine Hcl) .... As Needed 3)  Glucosamine Hcl 1000 Mg  Tabs (Glucosamine Hcl) .... As Needed 4)  Vytorin 10-40 Mg  Tabs (Ezetimibe-Simvastatin) .... Take 1 Tablet By Mouth Once A Day 5)  Advair Diskus 250-50 Mcg/dose  Misc (Fluticasone-Salmeterol) .... Two Times A Day 6)  Allegra 180 Mg Tabs (Fexofenadine Hcl) .Marland Kitchen.. 1 Once Daily 7)  Doxycycline Hyclate 100 Mg Caps (Doxycycline Hyclate) .... 0ne By Mouth Daily  Allergies (verified): No Known Drug Allergies  Past History:  Family History: Last updated: 10/04/2009 no family hx of prostate cnacer  Past medical, surgical, family and social histories (including risk factors) reviewed, and no changes noted (except as noted below).  Past Medical History: Reviewed history from 06/30/2007 and no changes required. Hyperlipidemia Hypertension  Family History: Reviewed history from 10/04/2009 and no changes required. no family hx of prostate cnacer  Social History: Reviewed history and no changes required.  Review of Systems  The patient denies anorexia, fever, weight loss, weight gain, vision loss, decreased hearing, hoarseness, chest pain, syncope, dyspnea on exertion, peripheral edema, prolonged cough, headaches, hemoptysis, abdominal pain, melena, hematochezia, severe indigestion/heartburn, hematuria,  incontinence, genital sores, muscle weakness, suspicious skin lesions, transient blindness, difficulty walking, depression, unusual weight change, abnormal bleeding, enlarged lymph nodes, angioedema, breast masses, and testicular masses.    Physical Exam  General:  Well-developed,well-nourished,in no acute distress; alert,appropriate and cooperative throughout examination Head:  normocephalic and male-pattern balding.   Eyes:  pupils equal, pupils round, and pupils reactive to light.   Ears:  R ear normal and L ear normal.  R ear normal and L ear normal.   Nose:  External nasal examination shows no deformity or inflammation. Nasal mucosa are pink and moist without lesions or exudates. Mouth:  Oral mucosa and oropharynx without lesions or exudates.  Teeth in good repair. Neck:  No deformities, masses, or tenderness noted. Lungs:  Normal respiratory effort, chest expands symmetrically. Lungs are clear to auscultation, no crackles or wheezes. Heart:  Normal rate and regular rhythm. S1 and S2 normal without gallop, murmur, click, rub or other extra sounds. Abdomen:  Bowel sounds positive,abdomen soft and non-tender without masses, organomegaly or hernias noted. Rectal:  No external abnormalities noted. Normal sphincter tone. No rectal masses or tenderness. Msk:  No deformity or scoliosis noted of thoracic or lumbar spine.   Pulses:  R and L carotid,radial,femoral,dorsalis pedis and posterior tibial pulses are full and equal bilaterally Extremities:  No clubbing, cyanosis, edema, or deformity noted with normal full range of motion of all joints.   Neurologic:  No cranial nerve deficits noted. Station and gait are normal. Plantar reflexes are down-going bilaterally. DTRs are symmetrical throughout. Sensory, motor and coordinative functions appear intact.   Impression & Recommendations:  Problem # 1:  PREVENTIVE HEALTH CARE (ICD-V70.0) discussion of psa Colonoscopy: normal (05/27/2005) Td Booster:  Td (10/06/2001)   Flu Vax: Fluvax 3+ (07/16/2009)   Chol: 176 (01/28/2010)   HDL: 63.30 (01/28/2010)   LDL: 98 (01/28/2010)   TG: 75.0 (01/28/2010) TSH: 2.57 (07/06/2009)   PSA: 3.42 (01/28/2010) Next Colonoscopy due:: 06/2015 (07/16/2009)  Discussed using sunscreen, use of alcohol, drug use, self testicular exam, routine dental care, routine eye care, routine physical exam, seat belts, multiple vitamins, osteoporosis prevention, adequate calcium intake in diet, and recommendations for immunizations.  Discussed exercise and checking cholesterol.  Discussed gun safety, safe sex, and contraception. Also recommend checking PSA.  Problem # 2:  HYPERTENSION (ICD-401.9)  His updated medication list for this problem includes:    Lotrel 5-10 Mg Caps (Amlodipine besy-benazepril hcl) .Marland Kitchen... 1 once daily- no more without ov  BP today: 136/80 Prior BP: 122/80 (10/04/2009)  Prior 10 Yr Risk Heart Disease: Not enough information (08/14/2008)  Labs Reviewed: K+: 4.2 (07/06/2009) Creat: : 0.9 (07/06/2009)   Chol: 176 (01/28/2010)   HDL: 63.30 (01/28/2010)   LDL: 98 (01/28/2010)   TG: 75.0 (01/28/2010)  Problem # 3:  CHRONIC OBSTRUCTIVE ASTHMA UNSPECIFIED (ICD-493.20) stable Labs Reviewed: SGOT: 30 (01/28/2010)   SGPT: 29 (01/28/2010)  Prior 10 Yr Risk Heart Disease: Not enough information (08/14/2008)   HDL:63.30 (01/28/2010), 53.40 (07/06/2009)  LDL:98 (01/28/2010)  Chol:176 (01/28/2010), 134 (07/06/2009)  Trig:75.0 (01/28/2010)  His updated medication list for this problem includes:    Advair Diskus 250-50 Mcg/dose Misc (Fluticasone-salmeterol) .Marland Kitchen..Marland Kitchen Two times a day  Problem # 4:  PROSTATE SPECIFIC ANTIGEN, ELEVATED (  ICD-790.93) we believe this is due to chronic inflamation  but will repeat the psa and discussed the biopsy process and the action planb if the bx are positive wide present I have spent greater that 45 min face to face evaluating this patient and over 1/2 of this time was in  councilling  Complete Medication List: 1)  Lotrel 5-10 Mg Caps (Amlodipine besy-benazepril hcl) .Marland Kitchen.. 1 once daily- no more without ov 2)  Astelin 137 Mcg/spray Soln (Azelastine hcl) .... As needed 3)  Glucosamine Hcl 1000 Mg Tabs (Glucosamine hcl) .... As needed 4)  Vytorin 10-40 Mg Tabs (Ezetimibe-simvastatin) .... Take 1 tablet by mouth once a day 5)  Advair Diskus 250-50 Mcg/dose Misc (Fluticasone-salmeterol) .... Two times a day 6)  Allegra 180 Mg Tabs (Fexofenadine hcl) .Marland Kitchen.. 1 once daily 7)  Doxycycline Hyclate 100 Mg Caps (Doxycycline hyclate) .... 0ne by mouth daily  Patient Instructions: 1)  Please schedule a follow-up appointment in 6  months. 2)  PSA prior to visit, ICD-9:601.9 Prescriptions: DOXYCYCLINE HYCLATE 100 MG CAPS (DOXYCYCLINE HYCLATE) 0ne by mouth daily  #30 x 6   Entered and Authorized by:   Stacie Glaze MD   Signed by:   Stacie Glaze MD on 02/04/2010   Method used:   [facility]        [facility]  [place] [address]       [address]       [place], [place]  [address]       Ph: [phone] or [phone]       Fax: [phone]   RxID:   [id number]

## 2010-11-05 NOTE — Assessment & Plan Note (Signed)
Summary: arm pain/neck pain/cb   Vital Signs:  Patient profile:   60 year old male Height:      69 inches Weight:      184 pounds BMI:     27.27 Temp:     98.2 degrees F oral Pulse rate:   72 / minute Resp:     14 per minute BP sitting:   120 / 80  (left arm)  Vitals Entered By: Willy Eddy, LPN (March 21, 2010 10:51 AM) CC: c/o neck and left shoulder painl   CC:  c/o neck and left shoulder painl.  History of Present Illness: shoulder pain in the neck and left shoulder that started about 2-3 weeks ago intermintant, no chest pain and no relation to exercize concerned about neck and vascular increased general fatigue with work related stress and fatigue  Problems Prior to Update: 1)  Cervical Radiculopathy  (ICD-723.4) 2)  Unspecified Osteoporosis  (ICD-733.00) 3)  Osteoarthritis, Moderate  (ICD-715.90) 4)  Prostate Specific Antigen, Elevated  (ICD-790.93) 5)  Preventive Health Care  (ICD-V70.0) 6)  Chronic Obstructive Asthma Unspecified  (ICD-493.20) 7)  Hypertension  (ICD-401.9) 8)  Hyperlipidemia  (ICD-272.4)  Current Problems (verified): 1)  Cervical Radiculopathy  (ICD-723.4) 2)  Unspecified Osteoporosis  (ICD-733.00) 3)  Osteoarthritis, Moderate  (ICD-715.90) 4)  Prostate Specific Antigen, Elevated  (ICD-790.93) 5)  Preventive Health Care  (ICD-V70.0) 6)  Chronic Obstructive Asthma Unspecified  (ICD-493.20) 7)  Hypertension  (ICD-401.9) 8)  Hyperlipidemia  (ICD-272.4)  Medications Prior to Update: 1)  Lotrel 5-10 Mg  Caps (Amlodipine Besy-Benazepril Hcl) .Marland Kitchen.. 1 Once Daily- No More Without Ov 2)  Astelin 137 Mcg/spray  Soln (Azelastine Hcl) .... As Needed 3)  Glucosamine Hcl 1000 Mg  Tabs (Glucosamine Hcl) .... As Needed 4)  Vytorin 10-40 Mg  Tabs (Ezetimibe-Simvastatin) .... Take 1 Tablet By Mouth Once A Day 5)  Advair Diskus 250-50 Mcg/dose  Misc (Fluticasone-Salmeterol) .... Two Times A Day 6)  Allegra 180 Mg Tabs (Fexofenadine Hcl) .Marland Kitchen.. 1 Once Daily 7)   Doxycycline Hyclate 100 Mg Caps (Doxycycline Hyclate) .... 0ne By Mouth Daily  Current Medications (verified): 1)  Lotrel 5-10 Mg  Caps (Amlodipine Besy-Benazepril Hcl) .Marland Kitchen.. 1 Once Daily- No More Without Ov 2)  Astelin 137 Mcg/spray  Soln (Azelastine Hcl) .... As Needed 3)  Glucosamine Hcl 1000 Mg  Tabs (Glucosamine Hcl) .... As Needed 4)  Vytorin 10-40 Mg  Tabs (Ezetimibe-Simvastatin) .... Take 1 Tablet By Mouth Once A Day 5)  Advair Diskus 250-50 Mcg/dose  Misc (Fluticasone-Salmeterol) .... Two Times A Day 6)  Allegra 180 Mg Tabs (Fexofenadine Hcl) .Marland Kitchen.. 1 Once Daily 7)  Doxycycline Hyclate 100 Mg Caps (Doxycycline Hyclate) .... 0ne By Mouth Daily  Allergies (verified): No Known Drug Allergies  Past History:  Past Medical History: Last updated: 06/30/2007 Hyperlipidemia Hypertension  Family History: Last updated: 10/04/2009 no family hx of prostate cnacer  Past surgical history reviewed for relevance to current acute and chronic problems.  Family History: Reviewed history from 10/04/2009 and no changes required. no family hx of prostate cnacer  Social History: Reviewed history and no changes required.  Review of Systems  The patient denies anorexia, fever, weight loss, weight gain, vision loss, decreased hearing, hoarseness, chest pain, syncope, dyspnea on exertion, peripheral edema, prolonged cough, headaches, hemoptysis, abdominal pain, melena, hematochezia, severe indigestion/heartburn, hematuria, incontinence, genital sores, muscle weakness, suspicious skin lesions, transient blindness, difficulty walking, depression, unusual weight change, abnormal bleeding, enlarged lymph nodes, angioedema, breast masses, and testicular  masses.    Physical Exam  General:  Well-developed,well-nourished,in no acute distress; alert,appropriate and cooperative throughout examination Head:  normocephalic and male-pattern balding.   Eyes:  pupils equal, pupils round, and pupils reactive to  light.   Ears:  R ear normal and L ear normal.  R ear normal and L ear normal.   Nose:  External nasal examination shows no deformity or inflammation. Nasal mucosa are pink and moist without lesions or exudates. Mouth:  Oral mucosa and oropharynx without lesions or exudates.  Teeth in good repair. Neck:  No deformities, masses, or tenderness noted. Lungs:  Normal respiratory effort, chest expands symmetrically. Lungs are clear to auscultation, no crackles or wheezes. Heart:  Normal rate and regular rhythm. S1 and S2 normal without gallop, murmur, click, rub or other extra sounds. Abdomen:  Bowel sounds positive,abdomen soft and non-tender without masses, organomegaly or hernias noted. Skin:  skin tags in groin   Impression & Recommendations:  Problem # 1:  CERVICAL RADICULOPATHY (ICD-723.4) exercize and therapy  Problem # 2:  HYPERTENSION (ICD-401.9)  His updated medication list for this problem includes:    Lotrel 5-10 Mg Caps (Amlodipine besy-benazepril hcl) .Marland Kitchen... 1 once daily- no more without ov  BP today: 120/80 Prior BP: 136/80 (02/04/2010)  Prior 10 Yr Risk Heart Disease: Not enough information (08/14/2008)  Labs Reviewed: K+: 4.2 (07/06/2009) Creat: : 0.9 (07/06/2009)   Chol: 176 (01/28/2010)   HDL: 63.30 (01/28/2010)   LDL: 98 (01/28/2010)   TG: 75.0 (01/28/2010)  Problem # 3:  OSTEOARTHRITIS, MODERATE (ICD-715.90)  Discussed use of medications, application of heat or cold, and exercises.   Problem # 4:  SKIN TAG (ICD-701.9)  removal today  Orders: Removal of Skin Tags up to 15 Lesions (11200)  Complete Medication List: 1)  Lotrel 5-10 Mg Caps (Amlodipine besy-benazepril hcl) .Marland Kitchen.. 1 once daily- no more without ov 2)  Astelin 137 Mcg/spray Soln (Azelastine hcl) .... As needed 3)  Glucosamine Hcl 1000 Mg Tabs (Glucosamine hcl) .... As needed 4)  Vytorin 10-40 Mg Tabs (Ezetimibe-simvastatin) .... Take 1 tablet by mouth once a day 5)  Advair Diskus 250-50 Mcg/dose  Misc (Fluticasone-salmeterol) .... Two times a day 6)  Allegra 180 Mg Tabs (Fexofenadine hcl) .Marland Kitchen.. 1 once daily 7)  Doxycycline Hyclate 100 Mg Caps (Doxycycline hyclate) .... 0ne by mouth daily  Patient Instructions: 1)  ice then heat to the neck 2)  stretching

## 2010-11-07 NOTE — Assessment & Plan Note (Signed)
Summary: CONGESTION//CCM   Vital Signs:  Patient profile:   60 year old male Weight:      185 pounds Pulse rate:   68 / minute Pulse rhythm:   regular BP sitting:   120 / 82  (left arm) Cuff size:   regular  Vitals Entered By: Kyung Rudd, CMA (October 18, 2010 1:05 PM) CC: pt c/o cough and congestion   CC:  pt c/o cough and congestion.  History of Present Illness: Patient presents to clinic as a workin for evaluation of cough. Notes cough x at least 2 wks with nasal congestion and drainage. Drainage and cough have been yellow. Denies f/c or hemoptysis. Cough worse at night. No alleviating or other exacerbating factors. No known sick expsoures. Requests refills of advair, lotrel and vytorin. BP reviewed as normotensive and well controlled.  Current Medications (verified): 1)  Lotrel 5-10 Mg  Caps (Amlodipine Besy-Benazepril Hcl) .Marland Kitchen.. 1 Once Daily- No More Without Ov 2)  Astelin 137 Mcg/spray  Soln (Azelastine Hcl) .... As Needed 3)  Glucosamine Hcl 1000 Mg  Tabs (Glucosamine Hcl) .... As Needed 4)  Vytorin 10-40 Mg  Tabs (Ezetimibe-Simvastatin) .... Take 1 Tablet By Mouth Once A Day 5)  Advair Diskus 250-50 Mcg/dose  Misc (Fluticasone-Salmeterol) .... Two Times A Day 6)  Allegra 180 Mg Tabs (Fexofenadine Hcl) .Marland Kitchen.. 1 Once Daily 7)  Doxycycline Hyclate 100 Mg Caps (Doxycycline Hyclate) .... 0ne By Mouth Daily  Allergies (verified): No Known Drug Allergies  Past History:  Past medical, surgical, family and social histories (including risk factors) reviewed, and no changes noted (except as noted below).  Past Medical History: Reviewed history from 06/30/2007 and no changes required. Hyperlipidemia Hypertension  Family History: Reviewed history from 10/04/2009 and no changes required. no family hx of prostate cnacer  Social History: Reviewed history and no changes required.  Review of Systems General:  Denies chills, fatigue, fever, and sweats. Eyes:  Denies  discharge, eye irritation, eye pain, and red eye. ENT:  Complains of nasal congestion and postnasal drainage; denies ear discharge and earache. Resp:  Complains of cough and sputum productive; denies coughing up blood, shortness of breath, and wheezing. Derm:  Denies changes in color of skin, flushing, and rash.  Physical Exam  General:  Well-developed,well-nourished,in no acute distress; alert,appropriate and cooperative throughout examination Head:  Normocephalic and atraumatic without obvious abnormalities. No apparent alopecia or balding. Eyes:  pupils equal, pupils round, corneas and lenses clear, and no injection.   Ears:  External ear exam shows no significant lesions or deformities.  Otoscopic examination reveals clear canals, tympanic membranes are intact bilaterally without bulging, retraction, inflammation or discharge. Hearing is grossly normal bilaterally. Nose:  External nasal examination shows no deformity or inflammation. Nasal mucosa are pink and moist without lesions or exudates. Mouth:  Oral mucosa and oropharynx without lesions or exudates.  Teeth in good repair. Neck:  No deformities, masses, or tenderness noted. Lungs:  Normal respiratory effort, chest expands symmetrically. Lungs are clear to auscultation, no crackles or wheezes. Skin:  turgor normal, color normal, and no rashes.     Impression & Recommendations:  Problem # 1:  BRONCHITIS (ICD-490) Assessment New Begin abx tx. Tussionex as needed cough. Cautioned regarding possible sedating effect. Followup if no improvement or worsening.   His updated medication list for this problem includes:    Zithromax Z-pak 250 Mg Tabs (Azithromycin) .Marland Kitchen... As directed    Tussionex Pennkinetic Er 10-8 Mg/52ml Lqcr (Hydrocod polst-chlorphen polst) .Marland KitchenMarland KitchenMarland KitchenMarland Kitchen 5ml by mouth  q12 hours as needed cough  Problem # 2:  HYPERTENSION (ICD-401.9) Assessment: Unchanged Normotensive and stable. Continue current regimen. Monitor bp as outpt and  f/u in clinic as scheduled. His updated medication list for this problem includes:    Lotrel 5-10 Mg Caps (Amlodipine besy-benazepril hcl) .Marland Kitchen... 1 once daily- no more without ov  Complete Medication List: 1)  Lotrel 5-10 Mg Caps (Amlodipine besy-benazepril hcl) .Marland Kitchen.. 1 once daily- no more without ov 2)  Astelin 137 Mcg/spray Soln (Azelastine hcl) .... As needed 3)  Glucosamine Hcl 1000 Mg Tabs (Glucosamine hcl) .... As needed 4)  Vytorin 10-40 Mg Tabs (Ezetimibe-simvastatin) .... Take 1 tablet by mouth once a day 5)  Advair Diskus 250-50 Mcg/dose Misc (Fluticasone-salmeterol) .... Two times a day 6)  Allegra 180 Mg Tabs (Fexofenadine hcl) .Marland Kitchen.. 1 once daily 7)  Doxycycline Hyclate 100 Mg Caps (Doxycycline hyclate) .... 0ne by mouth daily 8)  Zithromax Z-pak 250 Mg Tabs (Azithromycin) .... As directed 9)  Tussionex Pennkinetic Er 10-8 Mg/21ml Lqcr (Hydrocod polst-chlorphen polst) .... 5ml by mouth q12 hours as needed cough Prescriptions: ADVAIR DISKUS 250-50 MCG/DOSE  MISC (FLUTICASONE-SALMETEROL) two times a day  #1 x 0   Entered and Authorized by:   Edwyna Perfect MD   Signed by:   Edwyna Perfect MD on 10/18/2010   Method used:   [facility]        [facility]  [place] [address] ([facility]ail[facility]       [address]       [place], [place]  [address]       Ph: [phone] or [phone]       Fax: [phone]   RxID:   [id number] VYTORIN 10-40 MG  TABS (EZETIMIBE-SIMVASTATIN) Take 1 tablet by mouth once a day  #30 x 0   Entered and Authorized by:   Edwyna Perfect MD   Signed by:   Edwyna Perfect MD on 10/18/2010   Method used:   [facility]        [facility]  [place] [address] (retail[facility]       [address]       [place], [place]  [address]       Ph: [phone] or [phone]       Fax: [phone]   RxID:   [id number] LOTREL 5-10 MG  CAPS (AMLODIPINE BESY-BENAZEPRIL HCL) 1 once daily- no more without ov  #30 x 0   Entered and Authorized by:   Edwyna Perfect MD   Signed by:   Edwyna Perfect MD on 10/18/2010   Method used:   [facility]        [facility]  [place] [address]       [address]       [place], [place]  [address]       Ph: [phone] or [phone]       Fax: [phone]   RxID:   [id number] Sandria Senter ER 10-8 MG/5ML LQCR (HYDROCOD POLST-CHLORPHEN POLST) 5ml by mouth q12 hours as needed cough  #4 ounces x 0   Entered and Authorized by:   Edwyna Perfect MD   Signed by:   Edwyna Perfect MD on 10/18/2010   Method used:   Print then Give to Patient   RxID:   [id number] ZITHROMAX Z-PAK 250 MG TABS (AZITHROMYCIN) as directed Brand medically necessary #1 x 0   Entered and Authorized by:   Maisie Fus  Pricilla Handler MD   Signed by:   Edwyna Perfect MD on 10/18/2010   Method used:   [facility]        [facility]  [place] [address] ([facility]       [address]       [place], [place]  [address]       Ph: [phone] or [phone]       Fax: [phone]   RxID:   [id number]    Orders Added: 1)  Est. Patient Level IV [[address]]

## 2010-11-29 ENCOUNTER — Encounter: Payer: Self-pay | Admitting: Internal Medicine

## 2010-12-02 ENCOUNTER — Encounter: Payer: Self-pay | Admitting: Internal Medicine

## 2010-12-02 ENCOUNTER — Ambulatory Visit (INDEPENDENT_AMBULATORY_CARE_PROVIDER_SITE_OTHER): Payer: BC Managed Care – PPO | Admitting: Internal Medicine

## 2010-12-02 VITALS — BP 124/80 | HR 76 | Temp 98.2°F | Resp 14 | Ht 68.0 in | Wt 186.0 lb

## 2010-12-02 DIAGNOSIS — I10 Essential (primary) hypertension: Secondary | ICD-10-CM

## 2010-12-02 DIAGNOSIS — R972 Elevated prostate specific antigen [PSA]: Secondary | ICD-10-CM

## 2010-12-02 LAB — PSA: PSA: 4.31 ng/mL — ABNORMAL HIGH (ref 0.10–4.00)

## 2010-12-02 NOTE — Progress Notes (Signed)
  Subjective:    Patient ID: Ian Burns, male    DOB: Oct 06, 1951, 60 y.o.   MRN: [id number]  HPI    Review of Systems     Objective:   Physical Exam        Assessment & Plan:

## 2010-12-05 ENCOUNTER — Telehealth: Payer: Self-pay | Admitting: *Deleted

## 2010-12-05 DIAGNOSIS — N419 Inflammatory disease of prostate, unspecified: Secondary | ICD-10-CM

## 2010-12-05 MED ORDER — DOXYCYCLINE HYCLATE 100 MG PO TABS: 100.0000 mg | ORAL_TABLET | Freq: Two times a day (BID) | ORAL | Status: AC

## 2010-12-05 NOTE — Telephone Encounter (Signed)
Please call pt with his PSA results, and any instructions.

## 2010-12-05 NOTE — Telephone Encounter (Signed)
done

## 2010-12-05 NOTE — Telephone Encounter (Signed)
PSA is elevated  At 4.31 will treat with doxy 100 BID for 30 days and repeat

## 2011-01-20 ENCOUNTER — Other Ambulatory Visit: Payer: BC Managed Care – PPO

## 2011-01-27 ENCOUNTER — Ambulatory Visit (HOSPITAL_COMMUNITY)
Admission: RE | Admit: 2011-01-27 | Discharge: 2011-01-27 | Disposition: A | Discharge status: Home / Self Care | Payer: BC Managed Care – PPO | Source: Ambulatory Visit | Attending: Urology | Admitting: Urology

## 2011-01-27 ENCOUNTER — Ambulatory Visit (HOSPITAL_COMMUNITY): Payer: BC Managed Care – PPO

## 2011-01-27 DIAGNOSIS — I10 Essential (primary) hypertension: Secondary | ICD-10-CM | POA: Insufficient documentation

## 2011-01-27 DIAGNOSIS — Z01812 Encounter for preprocedural laboratory examination: Secondary | ICD-10-CM | POA: Insufficient documentation

## 2011-01-27 DIAGNOSIS — Z01810 Encounter for preprocedural cardiovascular examination: Secondary | ICD-10-CM | POA: Insufficient documentation

## 2011-01-27 DIAGNOSIS — J45909 Unspecified asthma, uncomplicated: Secondary | ICD-10-CM | POA: Insufficient documentation

## 2011-01-27 DIAGNOSIS — N400 Enlarged prostate without lower urinary tract symptoms: Secondary | ICD-10-CM | POA: Insufficient documentation

## 2011-01-27 DIAGNOSIS — N201 Calculus of ureter: Secondary | ICD-10-CM | POA: Insufficient documentation

## 2011-01-27 DIAGNOSIS — E7800 Pure hypercholesterolemia, unspecified: Secondary | ICD-10-CM | POA: Insufficient documentation

## 2011-01-27 LAB — BASIC METABOLIC PANEL
BUN: 16 mg/dL (ref 6–23)
CO2: 25 mEq/L (ref 19–32)
Calcium: 9 mg/dL (ref 8.4–10.5)
Chloride: 101 mEq/L (ref 96–112)
Creatinine, Ser: 1.1 mg/dL (ref 0.4–1.5)
GFR calc Af Amer: >60 mL/min (ref >60)
GFR calc non Af Amer: >60 mL/min (ref >60)
Glucose, Bld: 107 mg/dL — ABNORMAL HIGH (ref 70–99)
Potassium: 3.7 mEq/L (ref 3.5–5.1)
Sodium: 137 mEq/L (ref 135–145)

## 2011-01-27 LAB — SURGICAL PCR SCREEN
MRSA, PCR: NEGATIVE
Staphylococcus aureus: NEGATIVE

## 2011-02-03 ENCOUNTER — Ambulatory Visit (HOSPITAL_COMMUNITY): Admission: RE | Admit: 2011-02-03 | Payer: BC Managed Care – PPO | Source: Ambulatory Visit | Admitting: Urology

## 2011-02-14 ENCOUNTER — Other Ambulatory Visit: Payer: Self-pay | Admitting: Internal Medicine

## 2011-02-14 MED ORDER — EZETIMIBE-SIMVASTATIN 10-40 MG PO TABS: 1.0000 | ORAL_TABLET | Freq: Every day | ORAL | Status: DC

## 2011-02-14 MED ORDER — AMLODIPINE BESY-BENAZEPRIL HCL 5-10 MG PO CAPS: 1.0000 | ORAL_CAPSULE | Freq: Every day | ORAL | Status: DC

## 2011-03-17 ENCOUNTER — Other Ambulatory Visit: Payer: Self-pay | Admitting: *Deleted

## 2011-03-17 MED ORDER — EZETIMIBE-SIMVASTATIN 10-40 MG PO TABS: 1.0000 | ORAL_TABLET | Freq: Every day | ORAL | Status: DC

## 2011-03-17 MED ORDER — FLUTICASONE-SALMETEROL 250-50 MCG/DOSE IN AEPB: 1.0000 | INHALATION_SPRAY | Freq: Two times a day (BID) | RESPIRATORY_TRACT | Status: DC

## 2011-03-17 MED ORDER — AMLODIPINE BESY-BENAZEPRIL HCL 5-10 MG PO CAPS: 1.0000 | ORAL_CAPSULE | Freq: Every day | ORAL | Status: DC

## 2011-03-18 ENCOUNTER — Other Ambulatory Visit: Payer: Self-pay | Admitting: *Deleted

## 2011-03-18 MED ORDER — AMLODIPINE BESY-BENAZEPRIL HCL 5-10 MG PO CAPS: 1.0000 | ORAL_CAPSULE | Freq: Every day | ORAL | Status: DC

## 2011-04-05 ENCOUNTER — Other Ambulatory Visit: Payer: Self-pay | Admitting: Internal Medicine

## 2012-03-29 ENCOUNTER — Other Ambulatory Visit: Payer: Self-pay | Admitting: *Deleted

## 2012-03-29 MED ORDER — AMLODIPINE BESY-BENAZEPRIL HCL 5-10 MG PO CAPS: 1.0000 | ORAL_CAPSULE | Freq: Every day | ORAL | Status: DC

## 2012-03-29 MED ORDER — EZETIMIBE-SIMVASTATIN 10-40 MG PO TABS: 1.0000 | ORAL_TABLET | Freq: Every day | ORAL | Status: DC

## 2012-06-16 ENCOUNTER — Other Ambulatory Visit: Payer: Self-pay | Admitting: Internal Medicine

## 2012-06-16 MED ORDER — EZETIMIBE-SIMVASTATIN 10-40 MG PO TABS: 1.0000 | ORAL_TABLET | Freq: Every day | ORAL | Status: DC

## 2012-06-16 MED ORDER — AMLODIPINE BESY-BENAZEPRIL HCL 5-10 MG PO CAPS: 1.0000 | ORAL_CAPSULE | Freq: Every day | ORAL | Status: DC

## 2012-06-16 NOTE — Telephone Encounter (Signed)
Done

## 2012-06-16 NOTE — Telephone Encounter (Signed)
Pt needs refill on lotrel and vytorin #30 each [facility]

## 2012-06-17 ENCOUNTER — Other Ambulatory Visit: Payer: Self-pay | Admitting: Internal Medicine

## 2012-08-24 ENCOUNTER — Telehealth: Payer: Self-pay | Admitting: Internal Medicine

## 2012-08-24 NOTE — Telephone Encounter (Signed)
Pt has been sch for cpx labs 08-30-2012 and cpx 12-06-2012. Pt is requesting a call when lab results are back

## 2012-08-24 NOTE — Telephone Encounter (Signed)
Pt would like to have cpx labs next week and in December ov and cpx in feb,march 2014. Can I sch?

## 2012-08-24 NOTE — Telephone Encounter (Signed)
That is fine, but he may need to see another  Provider in December.

## 2012-08-25 NOTE — Telephone Encounter (Signed)
Sent pt a [employer]  activation code and told him he could take 3 times a day prn

## 2012-08-25 NOTE — Telephone Encounter (Signed)
Sent pt an [place] code number to retrive his

## 2012-08-30 ENCOUNTER — Ambulatory Visit (INDEPENDENT_AMBULATORY_CARE_PROVIDER_SITE_OTHER): Payer: BC Managed Care – PPO | Admitting: Internal Medicine

## 2012-08-30 ENCOUNTER — Other Ambulatory Visit (INDEPENDENT_AMBULATORY_CARE_PROVIDER_SITE_OTHER): Payer: BC Managed Care – PPO

## 2012-08-30 ENCOUNTER — Telehealth: Payer: Self-pay | Admitting: Internal Medicine

## 2012-08-30 DIAGNOSIS — Z23 Encounter for immunization: Secondary | ICD-10-CM

## 2012-08-30 DIAGNOSIS — Z0000 Encounter for general adult medical examination without abnormal findings: Secondary | ICD-10-CM

## 2012-08-30 LAB — CBC WITH DIFFERENTIAL/PLATELET
Basophils Absolute: 0 10*3/uL (ref 0.0–0.1)
Basophils Relative: 0.4 % (ref 0.0–3.0)
Eosinophils Absolute: 0.3 10*3/uL (ref 0.0–0.7)
Eosinophils Relative: 3.7 % (ref 0.0–5.0)
HCT: 46.6 % (ref 39.0–52.0)
Hemoglobin: 15.4 g/dL (ref 13.0–17.0)
Lymphocytes Relative: 28.5 % (ref 12.0–46.0)
Lymphs Abs: 2 10*3/uL (ref 0.7–4.0)
MCHC: 33 g/dL (ref 30.0–36.0)
MCV: 92.9 fl (ref 78.0–100.0)
Monocytes Absolute: 0.6 10*3/uL (ref 0.1–1.0)
Monocytes Relative: 8.3 % (ref 3.0–12.0)
Neutro Abs: 4.1 10*3/uL (ref 1.4–7.7)
Neutrophils Relative %: 59.1 % (ref 43.0–77.0)
Platelets: 259 10*3/uL (ref 150.0–400.0)
RBC: 5.02 Mil/uL (ref 4.22–5.81)
RDW: 13.6 % (ref 11.5–14.6)
WBC: 7 10*3/uL (ref 4.5–10.5)

## 2012-08-30 LAB — BASIC METABOLIC PANEL
BUN: 25 mg/dL — ABNORMAL HIGH (ref 6–23)
CO2: 29 mEq/L (ref 19–32)
Calcium: 9.6 mg/dL (ref 8.4–10.5)
Chloride: 106 mEq/L (ref 96–112)
Creatinine, Ser: 0.8 mg/dL (ref 0.4–1.5)
GFR: 99.36 mL/min (ref >60.00)
Glucose, Bld: 108 mg/dL — ABNORMAL HIGH (ref 70–99)
Potassium: 4.8 mEq/L (ref 3.5–5.1)
Sodium: 140 mEq/L (ref 135–145)

## 2012-08-30 LAB — POCT URINALYSIS DIPSTICK
Bilirubin, UA: NEGATIVE
Glucose, UA: NEGATIVE
Ketones, UA: NEGATIVE
Leukocytes, UA: NEGATIVE
Nitrite, UA: NEGATIVE
Spec Grav, UA: 1.02
Urobilinogen, UA: 0.2
pH, UA: 5.5

## 2012-08-30 LAB — HEPATIC FUNCTION PANEL
ALT: 27 U/L (ref 0–53)
AST: 27 U/L (ref 0–37)
Albumin: 4.3 g/dL (ref 3.5–5.2)
Alkaline Phosphatase: 52 U/L (ref 39–117)
Bilirubin, Direct: 0.2 mg/dL (ref 0.0–0.3)
Total Bilirubin: 0.8 mg/dL (ref 0.3–1.2)
Total Protein: 7 g/dL (ref 6.0–8.3)

## 2012-08-30 LAB — LIPID PANEL
Cholesterol: 142 mg/dL (ref 0–200)
HDL: 57.6 mg/dL (ref >39.00)
LDL Cholesterol: 74 mg/dL (ref 0–99)
Total CHOL/HDL Ratio: 2
Triglycerides: 51 mg/dL (ref 0.0–149.0)
VLDL: 10.2 mg/dL (ref 0.0–40.0)

## 2012-08-30 LAB — PSA: PSA: 3.94 ng/mL (ref 0.10–4.00)

## 2012-08-30 LAB — TSH: TSH: 2.69 u[IU]/mL (ref 0.35–5.50)

## 2012-08-30 MED ORDER — EZETIMIBE-SIMVASTATIN 10-40 MG PO TABS: 1.0000 | ORAL_TABLET | Freq: Every day | ORAL | Status: DC

## 2012-08-30 MED ORDER — FLUTICASONE-SALMETEROL 250-50 MCG/DOSE IN AEPB: 1.0000 | INHALATION_SPRAY | Freq: Two times a day (BID) | RESPIRATORY_TRACT | Status: DC

## 2012-08-30 MED ORDER — AMLODIPINE BESY-BENAZEPRIL HCL 5-10 MG PO CAPS: 1.0000 | ORAL_CAPSULE | Freq: Every day | ORAL | Status: DC

## 2012-08-30 NOTE — Telephone Encounter (Signed)
Patient came in stating that he needs new rxs written so that he may mail them to his [facility]. amLODipine-benazepril (LOTREL) 5-10 MG per capsule [[id number]]  Take 1 capsule by mouth daily, ezetimibe-simvastatin (VYTORIN) 10-40 MG per tablet [[id number]] Take 1 tablet by mouth at bedtime,  Fluticasone-Salmeterol (ADVAIR DISKUS) 250-50 MCG/DOSE AEPB [[id number]] Inhale 1 puff into the lungs every 12 (twelve) hours. Please assist.

## 2012-09-10 ENCOUNTER — Other Ambulatory Visit: Payer: Self-pay | Admitting: *Deleted

## 2012-09-10 MED ORDER — FLUTICASONE-SALMETEROL 250-50 MCG/DOSE IN AEPB: 1.0000 | INHALATION_SPRAY | Freq: Two times a day (BID) | RESPIRATORY_TRACT | Status: DC

## 2012-12-06 ENCOUNTER — Ambulatory Visit (INDEPENDENT_AMBULATORY_CARE_PROVIDER_SITE_OTHER): Payer: BC Managed Care – PPO | Admitting: Internal Medicine

## 2012-12-06 ENCOUNTER — Encounter: Payer: Self-pay | Admitting: Internal Medicine

## 2012-12-06 VITALS — BP 130/90 | HR 76 | Temp 98.2°F | Resp 16 | Ht 68.0 in | Wt 182.0 lb

## 2012-12-06 DIAGNOSIS — Z0000 Encounter for general adult medical examination without abnormal findings: Secondary | ICD-10-CM

## 2012-12-06 DIAGNOSIS — Z23 Encounter for immunization: Secondary | ICD-10-CM

## 2012-12-06 NOTE — Progress Notes (Signed)
Subjective:    Patient ID: Ian Burns, male    DOB: 1951/07/21, 62 y.o.   MRN: [id number]  HPI  cpx wellness    Review of Systems  Constitutional: Negative for fever and fatigue.  HENT: Negative for hearing loss, congestion, neck pain and postnasal drip.   Eyes: Negative for discharge, redness and visual disturbance.  Respiratory: Negative for cough, shortness of breath and wheezing.   Cardiovascular: Negative for leg swelling.  Gastrointestinal: Negative for abdominal pain, constipation and abdominal distention.  Genitourinary: Negative for urgency and frequency.  Musculoskeletal: Negative for joint swelling and arthralgias.  Skin: Negative for color change and rash.  Neurological: Negative for weakness and light-headedness.  Hematological: Negative for adenopathy.  Psychiatric/Behavioral: Negative for behavioral problems.   Past Medical History  Diagnosis Date  . HYPERLIPIDEMIA 06/30/2007  . HYPERTENSION 06/30/2007  . OSTEOARTHRITIS, MODERATE 10/04/2009  . PROSTATE SPECIFIC ANTIGEN, ELEVATED 07/16/2009  . UNSPECIFIED OSTEOPOROSIS 10/04/2009    History   Social History  . Marital Status: Married    Spouse Name: N/A    Number of Children: N/A  . Years of Education: N/A   Occupational History  . Not on file.   Social History Main Topics  . Smoking status: Current Every Day Smoker    Types: Cigars  . Smokeless tobacco: Never Used  . Alcohol Use: Yes  . Drug Use: No  . Sexually Active: Yes   Other Topics Concern  . Not on file   Social History Narrative  . No narrative on file    History reviewed. No pertinent past surgical history.  Family History  Problem Relation Age of Onset  . Prostate cancer Neg Hx     No Known Allergies  Current Outpatient Prescriptions on File Prior to Visit  Medication Sig Dispense Refill  . amLODipine-benazepril (LOTREL) 5-10 MG per capsule Take 1 capsule by mouth daily.  90 capsule  3  . ezetimibe-simvastatin (VYTORIN)  10-40 MG per tablet Take 1 tablet by mouth at bedtime.  90 tablet  3  . fexofenadine (ALLEGRA) 180 MG tablet Take 180 mg by mouth daily.        . Fluticasone-Salmeterol (ADVAIR DISKUS) 250-50 MCG/DOSE AEPB Inhale 1 puff into the lungs every 12 (twelve) hours.  90 each  3  . Glucosamine HCl 1000 MG TABS Take 1,000 mg by mouth daily.         No current facility-administered medications on file prior to visit.    BP 130/90  Pulse 76  Temp(Src) 98.2 F (36.8 C)  Resp 16  Ht 5\' 8"  (1.727 m)  Wt 182 lb (82.555 kg)  BMI 27.68 kg/m2        Objective:   Physical Exam  Constitutional: He appears well-developed and well-nourished.  HENT:  Head: Normocephalic and atraumatic.  Eyes: Conjunctivae are normal. Pupils are equal, round, and reactive to light.  Neck: Normal range of motion. Neck supple.  Cardiovascular: Normal rate and regular rhythm.   Pulmonary/Chest: Effort normal and breath sounds normal.  Abdominal: Soft. Bowel sounds are normal.          Assessment & Plan:   Patient presents for yearly preventative medicine examination.   all immunizations and health maintenance protocols were reviewed with the patient and they are up to date with these protocols.   screening laboratory values were reviewed with the patient including screening of hyperlipidemia PSA renal function and hepatic function.   There medications past medical history social history problem list and  allergies were reviewed in detail.   Goals were established with regard to weight loss exercise diet in compliance with medications  Discussion of  Diet

## 2013-03-21 ENCOUNTER — Encounter: Payer: Self-pay | Admitting: *Deleted

## 2013-06-20 ENCOUNTER — Encounter: Payer: Self-pay | Admitting: Internal Medicine

## 2013-06-20 ENCOUNTER — Ambulatory Visit (INDEPENDENT_AMBULATORY_CARE_PROVIDER_SITE_OTHER): Payer: BC Managed Care – PPO | Admitting: Internal Medicine

## 2013-06-20 VITALS — BP 126/86 | HR 76 | Temp 98.2°F | Resp 16 | Ht 68.0 in | Wt 182.0 lb

## 2013-06-20 DIAGNOSIS — E785 Hyperlipidemia, unspecified: Secondary | ICD-10-CM

## 2013-06-20 DIAGNOSIS — IMO0002 Reserved for concepts with insufficient information to code with codable children: Secondary | ICD-10-CM

## 2013-06-20 DIAGNOSIS — H9190 Unspecified hearing loss, unspecified ear: Secondary | ICD-10-CM

## 2013-06-20 DIAGNOSIS — R339 Retention of urine, unspecified: Secondary | ICD-10-CM

## 2013-06-20 DIAGNOSIS — Z23 Encounter for immunization: Secondary | ICD-10-CM

## 2013-06-20 DIAGNOSIS — M792 Neuralgia and neuritis, unspecified: Secondary | ICD-10-CM

## 2013-06-20 DIAGNOSIS — I10 Essential (primary) hypertension: Secondary | ICD-10-CM

## 2013-06-20 DIAGNOSIS — N401 Enlarged prostate with lower urinary tract symptoms: Secondary | ICD-10-CM

## 2013-06-20 DIAGNOSIS — H9193 Unspecified hearing loss, bilateral: Secondary | ICD-10-CM

## 2013-06-20 LAB — PSA: PSA: 3.64 ng/mL (ref 0.10–4.00)

## 2013-06-20 LAB — LIPID PANEL
Cholesterol: 154 mg/dL (ref 0–200)
HDL: 62.8 mg/dL (ref >39.00)
LDL Cholesterol: 81 mg/dL (ref 0–99)
Total CHOL/HDL Ratio: 2
Triglycerides: 50 mg/dL (ref 0.0–149.0)
VLDL: 10 mg/dL (ref 0.0–40.0)

## 2013-06-20 LAB — COMPREHENSIVE METABOLIC PANEL
ALT: 23 U/L (ref 0–53)
AST: 27 U/L (ref 0–37)
Albumin: 4.5 g/dL (ref 3.5–5.2)
Alkaline Phosphatase: 55 U/L (ref 39–117)
BUN: 13 mg/dL (ref 6–23)
CO2: 28 mEq/L (ref 19–32)
Calcium: 9.5 mg/dL (ref 8.4–10.5)
Chloride: 104 mEq/L (ref 96–112)
Creatinine, Ser: 0.7 mg/dL (ref 0.4–1.5)
GFR: 120.64 mL/min (ref >60.00)
Glucose, Bld: 104 mg/dL — ABNORMAL HIGH (ref 70–99)
Potassium: 4.9 mEq/L (ref 3.5–5.1)
Sodium: 138 mEq/L (ref 135–145)
Total Bilirubin: 0.8 mg/dL (ref 0.3–1.2)
Total Protein: 7.1 g/dL (ref 6.0–8.3)

## 2013-06-20 LAB — LDL CHOLESTEROL, DIRECT: Direct LDL: 74.8 mg/dL

## 2013-06-20 MED ORDER — LORAZEPAM 1 MG PO TABS: 1.0000 mg | ORAL_TABLET | Freq: Once | ORAL | Status: DC

## 2013-06-20 NOTE — Progress Notes (Signed)
  Subjective:    Patient ID: Ian Burns, male    DOB: August 28, 1951, 62 y.o.   MRN: [id number]  HPI Follow up Stable blood pressure with readings at homein the 120/80 rnage   Review of Systems  Constitutional: Negative for fever and fatigue.  HENT: Negative for hearing loss, congestion, neck pain and postnasal drip.   Eyes: Negative for discharge, redness and visual disturbance.  Respiratory: Negative for cough, shortness of breath and wheezing.   Cardiovascular: Negative for leg swelling.  Gastrointestinal: Negative for abdominal pain, constipation and abdominal distention.  Genitourinary: Negative for urgency and frequency.  Musculoskeletal: Negative for joint swelling and arthralgias.  Skin: Negative for color change and rash.  Neurological: Negative for weakness and light-headedness.  Hematological: Negative for adenopathy.  Psychiatric/Behavioral: Negative for behavioral problems.       Objective:   Physical Exam  Nursing note and vitals reviewed. Constitutional: He appears well-developed and well-nourished.  HENT:  Head: Normocephalic and atraumatic.  Eyes: Conjunctivae are normal. Pupils are equal, round, and reactive to light.  Neck: Normal range of motion. Neck supple.  Cardiovascular: Normal rate and regular rhythm.   Pulmonary/Chest: Effort normal and breath sounds normal.  Abdominal: Soft. Bowel sounds are normal.          Assessment & Plan:  Follow up HTN and lipids Fasting for blood work Stable asthma  Referral for hearing loss

## 2013-06-20 NOTE — Patient Instructions (Signed)
The patient is instructed to continue all medications as prescribed. Schedule followup with check out clerk upon leaving the clinic  

## 2013-07-18 ENCOUNTER — Telehealth: Payer: Self-pay | Admitting: Internal Medicine

## 2013-07-18 ENCOUNTER — Ambulatory Visit: Payer: BC Managed Care – PPO | Admitting: Family

## 2013-07-18 NOTE — Telephone Encounter (Signed)
Pt would like a nicotine test. Pls advise.

## 2013-07-19 NOTE — Telephone Encounter (Signed)
May schedule this - diagnosis is former cigar smoker

## 2013-07-19 NOTE — Telephone Encounter (Signed)
I'm sorry. This is a Jenkins pt. This pt would like a nicotine test. Pls advise.

## 2013-07-22 ENCOUNTER — Other Ambulatory Visit (INDEPENDENT_AMBULATORY_CARE_PROVIDER_SITE_OTHER): Payer: BC Managed Care – PPO

## 2013-07-22 DIAGNOSIS — J449 Chronic obstructive pulmonary disease, unspecified: Secondary | ICD-10-CM

## 2013-07-26 LAB — NICOTINE SCREEN, URINE
Anabasine, urine: NEGATIVE ng/mL
Cotinine, Urine: NEGATIVE ng/mL
Nicotine, urine: NEGATIVE ng/mL

## 2013-07-29 ENCOUNTER — Ambulatory Visit: Payer: BC Managed Care – PPO | Admitting: Family

## 2013-08-09 NOTE — Telephone Encounter (Signed)
His nicotine was neg and i Left message on machine Several weeks ago

## 2013-08-09 NOTE — Telephone Encounter (Signed)
Pt sent an email wanting to know when results of nicotine test will be ready.But email was last week.  I'm not sure if pt has gotten these results yet.

## 2013-08-18 ENCOUNTER — Other Ambulatory Visit: Payer: Self-pay | Admitting: *Deleted

## 2013-08-18 MED ORDER — AMLODIPINE BESY-BENAZEPRIL HCL 5-10 MG PO CAPS: 1.0000 | ORAL_CAPSULE | Freq: Every day | ORAL | Status: DC

## 2013-08-29 ENCOUNTER — Ambulatory Visit: Payer: BC Managed Care – PPO | Admitting: Family

## 2013-10-06 HISTORY — PX: COLONOSCOPY: SHX174

## 2013-12-19 ENCOUNTER — Ambulatory Visit: Payer: BC Managed Care – PPO | Admitting: Internal Medicine

## 2014-03-01 ENCOUNTER — Telehealth: Payer: Self-pay | Admitting: Internal Medicine

## 2014-03-01 MED ORDER — FLUTICASONE-SALMETEROL 250-50 MCG/DOSE IN AEPB: 1.0000 | INHALATION_SPRAY | Freq: Two times a day (BID) | RESPIRATORY_TRACT | Status: AC

## 2014-03-01 NOTE — Telephone Encounter (Signed)
rx sent in electronically 

## 2014-03-01 NOTE — Telephone Encounter (Signed)
[facility] [place] [place] is requesting 90 day re-fill on Fluticasone-Salmeterol (ADVAIR DISKUS) 250-50 MCG/DOSE AEPB

## 2014-04-30 ENCOUNTER — Encounter: Payer: Self-pay | Admitting: Internal Medicine

## 2014-08-29 ENCOUNTER — Telehealth: Payer: Self-pay | Admitting: Internal Medicine

## 2014-08-29 NOTE — Telephone Encounter (Signed)
error 

## 2014-09-05 ENCOUNTER — Telehealth: Payer: Self-pay

## 2014-09-05 NOTE — Telephone Encounter (Signed)
Telephone numbers listed in patient's chart are not in service.  Unable to contact pt to set-up an appointment with new provider.

## 2014-09-05 NOTE — Telephone Encounter (Signed)
Rx request for Amlodipine Besy-Benazepril HCL  5-10 mg- Take 1 tablet by mouth daily.  Request for Vytorin 10-40- Take 1 tablet by mouth at bedtime. Pls advise.

## 2014-09-05 NOTE — Telephone Encounter (Signed)
Pt needs to establish with a new provider.  Ok to do med check if he can't get in to be establish soon

## 2015-06-03 ENCOUNTER — Encounter: Payer: Self-pay | Admitting: Internal Medicine

## 2016-01-08 DIAGNOSIS — N401 Enlarged prostate with lower urinary tract symptoms: Secondary | ICD-10-CM | POA: Diagnosis not present

## 2016-01-08 DIAGNOSIS — R3915 Urgency of urination: Secondary | ICD-10-CM | POA: Diagnosis not present

## 2016-01-08 DIAGNOSIS — R3912 Poor urinary stream: Secondary | ICD-10-CM | POA: Diagnosis not present

## 2016-01-08 DIAGNOSIS — R3914 Feeling of incomplete bladder emptying: Secondary | ICD-10-CM | POA: Diagnosis not present

## 2016-02-05 DIAGNOSIS — R350 Frequency of micturition: Secondary | ICD-10-CM | POA: Diagnosis not present

## 2016-02-05 DIAGNOSIS — R3915 Urgency of urination: Secondary | ICD-10-CM | POA: Diagnosis not present

## 2016-02-05 DIAGNOSIS — R3914 Feeling of incomplete bladder emptying: Secondary | ICD-10-CM | POA: Diagnosis not present

## 2016-02-05 DIAGNOSIS — N401 Enlarged prostate with lower urinary tract symptoms: Secondary | ICD-10-CM | POA: Diagnosis not present

## 2016-02-06 DIAGNOSIS — K2900 Acute gastritis without bleeding: Secondary | ICD-10-CM | POA: Diagnosis not present

## 2016-02-06 DIAGNOSIS — K208 Other esophagitis: Secondary | ICD-10-CM | POA: Diagnosis not present

## 2016-02-06 DIAGNOSIS — K219 Gastro-esophageal reflux disease without esophagitis: Secondary | ICD-10-CM | POA: Diagnosis not present

## 2016-02-06 DIAGNOSIS — K2970 Gastritis, unspecified, without bleeding: Secondary | ICD-10-CM | POA: Diagnosis not present

## 2016-09-25 DIAGNOSIS — M9902 Segmental and somatic dysfunction of thoracic region: Secondary | ICD-10-CM | POA: Diagnosis not present

## 2016-09-25 DIAGNOSIS — M50122 Cervical disc disorder at C5-C6 level with radiculopathy: Secondary | ICD-10-CM | POA: Diagnosis not present

## 2016-09-25 DIAGNOSIS — M9901 Segmental and somatic dysfunction of cervical region: Secondary | ICD-10-CM | POA: Diagnosis not present

## 2016-09-25 DIAGNOSIS — M50322 Other cervical disc degeneration at C5-C6 level: Secondary | ICD-10-CM | POA: Diagnosis not present

## 2016-10-01 DIAGNOSIS — M519 Unspecified thoracic, thoracolumbar and lumbosacral intervertebral disc disorder: Secondary | ICD-10-CM | POA: Diagnosis not present

## 2016-10-01 DIAGNOSIS — M5031 Other cervical disc degeneration,  high cervical region: Secondary | ICD-10-CM | POA: Diagnosis not present

## 2016-10-01 DIAGNOSIS — M419 Scoliosis, unspecified: Secondary | ICD-10-CM | POA: Diagnosis not present

## 2016-10-01 DIAGNOSIS — M50122 Cervical disc disorder at C5-C6 level with radiculopathy: Secondary | ICD-10-CM | POA: Diagnosis not present

## 2016-10-01 DIAGNOSIS — M9902 Segmental and somatic dysfunction of thoracic region: Secondary | ICD-10-CM | POA: Diagnosis not present

## 2016-10-01 DIAGNOSIS — M47812 Spondylosis without myelopathy or radiculopathy, cervical region: Secondary | ICD-10-CM | POA: Diagnosis not present

## 2016-10-01 DIAGNOSIS — M50322 Other cervical disc degeneration at C5-C6 level: Secondary | ICD-10-CM | POA: Diagnosis not present

## 2016-10-01 DIAGNOSIS — M9901 Segmental and somatic dysfunction of cervical region: Secondary | ICD-10-CM | POA: Diagnosis not present

## 2016-10-03 DIAGNOSIS — M9901 Segmental and somatic dysfunction of cervical region: Secondary | ICD-10-CM | POA: Diagnosis not present

## 2016-10-03 DIAGNOSIS — M9902 Segmental and somatic dysfunction of thoracic region: Secondary | ICD-10-CM | POA: Diagnosis not present

## 2016-10-03 DIAGNOSIS — M50122 Cervical disc disorder at C5-C6 level with radiculopathy: Secondary | ICD-10-CM | POA: Diagnosis not present

## 2016-10-03 DIAGNOSIS — M50322 Other cervical disc degeneration at C5-C6 level: Secondary | ICD-10-CM | POA: Diagnosis not present

## 2016-10-08 DIAGNOSIS — M545 Low back pain: Secondary | ICD-10-CM | POA: Diagnosis not present

## 2016-10-09 DIAGNOSIS — M50122 Cervical disc disorder at C5-C6 level with radiculopathy: Secondary | ICD-10-CM | POA: Diagnosis not present

## 2016-10-09 DIAGNOSIS — M50322 Other cervical disc degeneration at C5-C6 level: Secondary | ICD-10-CM | POA: Diagnosis not present

## 2016-10-09 DIAGNOSIS — M9901 Segmental and somatic dysfunction of cervical region: Secondary | ICD-10-CM | POA: Diagnosis not present

## 2016-10-09 DIAGNOSIS — M9902 Segmental and somatic dysfunction of thoracic region: Secondary | ICD-10-CM | POA: Diagnosis not present

## 2016-10-17 DIAGNOSIS — M50122 Cervical disc disorder at C5-C6 level with radiculopathy: Secondary | ICD-10-CM | POA: Diagnosis not present

## 2016-10-17 DIAGNOSIS — M50322 Other cervical disc degeneration at C5-C6 level: Secondary | ICD-10-CM | POA: Diagnosis not present

## 2016-10-17 DIAGNOSIS — M9901 Segmental and somatic dysfunction of cervical region: Secondary | ICD-10-CM | POA: Diagnosis not present

## 2016-10-17 DIAGNOSIS — M9902 Segmental and somatic dysfunction of thoracic region: Secondary | ICD-10-CM | POA: Diagnosis not present

## 2016-10-31 DIAGNOSIS — M50322 Other cervical disc degeneration at C5-C6 level: Secondary | ICD-10-CM | POA: Diagnosis not present

## 2016-10-31 DIAGNOSIS — M9902 Segmental and somatic dysfunction of thoracic region: Secondary | ICD-10-CM | POA: Diagnosis not present

## 2016-10-31 DIAGNOSIS — M50122 Cervical disc disorder at C5-C6 level with radiculopathy: Secondary | ICD-10-CM | POA: Diagnosis not present

## 2016-10-31 DIAGNOSIS — M9901 Segmental and somatic dysfunction of cervical region: Secondary | ICD-10-CM | POA: Diagnosis not present

## 2016-11-07 DIAGNOSIS — M50122 Cervical disc disorder at C5-C6 level with radiculopathy: Secondary | ICD-10-CM | POA: Diagnosis not present

## 2016-11-07 DIAGNOSIS — M9902 Segmental and somatic dysfunction of thoracic region: Secondary | ICD-10-CM | POA: Diagnosis not present

## 2016-11-07 DIAGNOSIS — M9901 Segmental and somatic dysfunction of cervical region: Secondary | ICD-10-CM | POA: Diagnosis not present

## 2016-11-07 DIAGNOSIS — M50322 Other cervical disc degeneration at C5-C6 level: Secondary | ICD-10-CM | POA: Diagnosis not present

## 2017-07-10 DIAGNOSIS — M2041 Other hammer toe(s) (acquired), right foot: Secondary | ICD-10-CM | POA: Diagnosis not present

## 2017-07-10 DIAGNOSIS — M79671 Pain in right foot: Secondary | ICD-10-CM | POA: Diagnosis not present

## 2017-07-10 DIAGNOSIS — L84 Corns and callosities: Secondary | ICD-10-CM | POA: Diagnosis not present

## 2017-08-14 DIAGNOSIS — D485 Neoplasm of uncertain behavior of skin: Secondary | ICD-10-CM | POA: Diagnosis not present

## 2017-08-14 DIAGNOSIS — L570 Actinic keratosis: Secondary | ICD-10-CM | POA: Diagnosis not present

## 2017-08-14 DIAGNOSIS — L309 Dermatitis, unspecified: Secondary | ICD-10-CM | POA: Diagnosis not present

## 2017-11-06 DIAGNOSIS — Z1389 Encounter for screening for other disorder: Secondary | ICD-10-CM | POA: Diagnosis not present

## 2017-11-06 DIAGNOSIS — J45998 Other asthma: Secondary | ICD-10-CM | POA: Diagnosis not present

## 2017-11-06 DIAGNOSIS — E7849 Other hyperlipidemia: Secondary | ICD-10-CM | POA: Diagnosis not present

## 2017-11-06 DIAGNOSIS — I10 Essential (primary) hypertension: Secondary | ICD-10-CM | POA: Diagnosis not present

## 2017-11-06 DIAGNOSIS — R05 Cough: Secondary | ICD-10-CM | POA: Diagnosis not present

## 2017-11-30 DIAGNOSIS — J45998 Other asthma: Secondary | ICD-10-CM | POA: Diagnosis not present

## 2017-11-30 DIAGNOSIS — Z6830 Body mass index (BMI) 30.0-30.9, adult: Secondary | ICD-10-CM | POA: Diagnosis not present

## 2017-11-30 DIAGNOSIS — I10 Essential (primary) hypertension: Secondary | ICD-10-CM | POA: Diagnosis not present

## 2018-02-04 DIAGNOSIS — M7581 Other shoulder lesions, right shoulder: Secondary | ICD-10-CM | POA: Diagnosis not present

## 2018-02-04 DIAGNOSIS — S5011XA Contusion of right forearm, initial encounter: Secondary | ICD-10-CM | POA: Diagnosis not present

## 2018-03-02 DIAGNOSIS — S5011XD Contusion of right forearm, subsequent encounter: Secondary | ICD-10-CM | POA: Diagnosis not present

## 2018-06-08 DIAGNOSIS — L718 Other rosacea: Secondary | ICD-10-CM | POA: Diagnosis not present

## 2018-06-08 DIAGNOSIS — L570 Actinic keratosis: Secondary | ICD-10-CM | POA: Diagnosis not present

## 2018-06-08 DIAGNOSIS — D485 Neoplasm of uncertain behavior of skin: Secondary | ICD-10-CM | POA: Diagnosis not present

## 2018-08-16 DIAGNOSIS — M7522 Bicipital tendinitis, left shoulder: Secondary | ICD-10-CM | POA: Diagnosis not present

## 2018-08-23 DIAGNOSIS — M9902 Segmental and somatic dysfunction of thoracic region: Secondary | ICD-10-CM | POA: Diagnosis not present

## 2018-08-23 DIAGNOSIS — M50322 Other cervical disc degeneration at C5-C6 level: Secondary | ICD-10-CM | POA: Diagnosis not present

## 2018-08-23 DIAGNOSIS — M9901 Segmental and somatic dysfunction of cervical region: Secondary | ICD-10-CM | POA: Diagnosis not present

## 2018-08-23 DIAGNOSIS — M50122 Cervical disc disorder at C5-C6 level with radiculopathy: Secondary | ICD-10-CM | POA: Diagnosis not present

## 2018-08-30 DIAGNOSIS — M9901 Segmental and somatic dysfunction of cervical region: Secondary | ICD-10-CM | POA: Diagnosis not present

## 2018-08-30 DIAGNOSIS — M9902 Segmental and somatic dysfunction of thoracic region: Secondary | ICD-10-CM | POA: Diagnosis not present

## 2018-08-30 DIAGNOSIS — M50122 Cervical disc disorder at C5-C6 level with radiculopathy: Secondary | ICD-10-CM | POA: Diagnosis not present

## 2018-08-30 DIAGNOSIS — M50322 Other cervical disc degeneration at C5-C6 level: Secondary | ICD-10-CM | POA: Diagnosis not present

## 2018-08-31 DIAGNOSIS — M1811 Unilateral primary osteoarthritis of first carpometacarpal joint, right hand: Secondary | ICD-10-CM | POA: Diagnosis not present

## 2018-08-31 DIAGNOSIS — M79644 Pain in right finger(s): Secondary | ICD-10-CM | POA: Diagnosis not present

## 2018-09-01 DIAGNOSIS — M50322 Other cervical disc degeneration at C5-C6 level: Secondary | ICD-10-CM | POA: Diagnosis not present

## 2018-09-01 DIAGNOSIS — M50122 Cervical disc disorder at C5-C6 level with radiculopathy: Secondary | ICD-10-CM | POA: Diagnosis not present

## 2018-09-01 DIAGNOSIS — M9902 Segmental and somatic dysfunction of thoracic region: Secondary | ICD-10-CM | POA: Diagnosis not present

## 2018-09-01 DIAGNOSIS — M9901 Segmental and somatic dysfunction of cervical region: Secondary | ICD-10-CM | POA: Diagnosis not present

## 2018-09-06 DIAGNOSIS — M9901 Segmental and somatic dysfunction of cervical region: Secondary | ICD-10-CM | POA: Diagnosis not present

## 2018-09-06 DIAGNOSIS — M50322 Other cervical disc degeneration at C5-C6 level: Secondary | ICD-10-CM | POA: Diagnosis not present

## 2018-09-06 DIAGNOSIS — M50122 Cervical disc disorder at C5-C6 level with radiculopathy: Secondary | ICD-10-CM | POA: Diagnosis not present

## 2018-09-06 DIAGNOSIS — M9902 Segmental and somatic dysfunction of thoracic region: Secondary | ICD-10-CM | POA: Diagnosis not present

## 2018-09-13 DIAGNOSIS — M50322 Other cervical disc degeneration at C5-C6 level: Secondary | ICD-10-CM | POA: Diagnosis not present

## 2018-09-13 DIAGNOSIS — M50122 Cervical disc disorder at C5-C6 level with radiculopathy: Secondary | ICD-10-CM | POA: Diagnosis not present

## 2018-09-13 DIAGNOSIS — M9901 Segmental and somatic dysfunction of cervical region: Secondary | ICD-10-CM | POA: Diagnosis not present

## 2018-09-13 DIAGNOSIS — M9902 Segmental and somatic dysfunction of thoracic region: Secondary | ICD-10-CM | POA: Diagnosis not present

## 2018-09-27 DIAGNOSIS — M9901 Segmental and somatic dysfunction of cervical region: Secondary | ICD-10-CM | POA: Diagnosis not present

## 2018-09-27 DIAGNOSIS — M9902 Segmental and somatic dysfunction of thoracic region: Secondary | ICD-10-CM | POA: Diagnosis not present

## 2018-09-27 DIAGNOSIS — M50122 Cervical disc disorder at C5-C6 level with radiculopathy: Secondary | ICD-10-CM | POA: Diagnosis not present

## 2018-09-27 DIAGNOSIS — M50322 Other cervical disc degeneration at C5-C6 level: Secondary | ICD-10-CM | POA: Diagnosis not present

## 2018-10-04 DIAGNOSIS — M9902 Segmental and somatic dysfunction of thoracic region: Secondary | ICD-10-CM | POA: Diagnosis not present

## 2018-10-04 DIAGNOSIS — M50122 Cervical disc disorder at C5-C6 level with radiculopathy: Secondary | ICD-10-CM | POA: Diagnosis not present

## 2018-10-04 DIAGNOSIS — M50322 Other cervical disc degeneration at C5-C6 level: Secondary | ICD-10-CM | POA: Diagnosis not present

## 2018-10-04 DIAGNOSIS — M9901 Segmental and somatic dysfunction of cervical region: Secondary | ICD-10-CM | POA: Diagnosis not present

## 2018-10-08 DIAGNOSIS — M50322 Other cervical disc degeneration at C5-C6 level: Secondary | ICD-10-CM | POA: Diagnosis not present

## 2018-10-08 DIAGNOSIS — M9902 Segmental and somatic dysfunction of thoracic region: Secondary | ICD-10-CM | POA: Diagnosis not present

## 2018-10-08 DIAGNOSIS — M9901 Segmental and somatic dysfunction of cervical region: Secondary | ICD-10-CM | POA: Diagnosis not present

## 2018-10-08 DIAGNOSIS — M50122 Cervical disc disorder at C5-C6 level with radiculopathy: Secondary | ICD-10-CM | POA: Diagnosis not present

## 2018-10-11 DIAGNOSIS — M50122 Cervical disc disorder at C5-C6 level with radiculopathy: Secondary | ICD-10-CM | POA: Diagnosis not present

## 2018-10-11 DIAGNOSIS — M9901 Segmental and somatic dysfunction of cervical region: Secondary | ICD-10-CM | POA: Diagnosis not present

## 2018-10-11 DIAGNOSIS — M9902 Segmental and somatic dysfunction of thoracic region: Secondary | ICD-10-CM | POA: Diagnosis not present

## 2018-10-11 DIAGNOSIS — M50322 Other cervical disc degeneration at C5-C6 level: Secondary | ICD-10-CM | POA: Diagnosis not present

## 2018-10-18 DIAGNOSIS — M50322 Other cervical disc degeneration at C5-C6 level: Secondary | ICD-10-CM | POA: Diagnosis not present

## 2018-10-18 DIAGNOSIS — M9902 Segmental and somatic dysfunction of thoracic region: Secondary | ICD-10-CM | POA: Diagnosis not present

## 2018-10-18 DIAGNOSIS — M50122 Cervical disc disorder at C5-C6 level with radiculopathy: Secondary | ICD-10-CM | POA: Diagnosis not present

## 2018-10-18 DIAGNOSIS — M9901 Segmental and somatic dysfunction of cervical region: Secondary | ICD-10-CM | POA: Diagnosis not present

## 2018-11-03 DIAGNOSIS — L738 Other specified follicular disorders: Secondary | ICD-10-CM | POA: Diagnosis not present

## 2018-11-03 DIAGNOSIS — L300 Nummular dermatitis: Secondary | ICD-10-CM | POA: Diagnosis not present

## 2018-11-03 DIAGNOSIS — L821 Other seborrheic keratosis: Secondary | ICD-10-CM | POA: Diagnosis not present

## 2019-05-05 DIAGNOSIS — M2041 Other hammer toe(s) (acquired), right foot: Secondary | ICD-10-CM | POA: Diagnosis not present

## 2019-05-05 DIAGNOSIS — L84 Corns and callosities: Secondary | ICD-10-CM | POA: Diagnosis not present

## 2019-05-05 DIAGNOSIS — M21612 Bunion of left foot: Secondary | ICD-10-CM | POA: Diagnosis not present

## 2019-05-05 DIAGNOSIS — M2042 Other hammer toe(s) (acquired), left foot: Secondary | ICD-10-CM | POA: Diagnosis not present

## 2019-06-15 DIAGNOSIS — D1801 Hemangioma of skin and subcutaneous tissue: Secondary | ICD-10-CM | POA: Diagnosis not present

## 2019-06-15 DIAGNOSIS — D225 Melanocytic nevi of trunk: Secondary | ICD-10-CM | POA: Diagnosis not present

## 2019-06-15 DIAGNOSIS — D044 Carcinoma in situ of skin of scalp and neck: Secondary | ICD-10-CM | POA: Diagnosis not present

## 2019-06-15 DIAGNOSIS — L821 Other seborrheic keratosis: Secondary | ICD-10-CM | POA: Diagnosis not present

## 2019-06-15 DIAGNOSIS — L570 Actinic keratosis: Secondary | ICD-10-CM | POA: Diagnosis not present

## 2019-06-15 DIAGNOSIS — L814 Other melanin hyperpigmentation: Secondary | ICD-10-CM | POA: Diagnosis not present

## 2019-06-28 DIAGNOSIS — H524 Presbyopia: Secondary | ICD-10-CM | POA: Diagnosis not present

## 2019-06-28 DIAGNOSIS — H2513 Age-related nuclear cataract, bilateral: Secondary | ICD-10-CM | POA: Diagnosis not present

## 2019-07-13 DIAGNOSIS — D044 Carcinoma in situ of skin of scalp and neck: Secondary | ICD-10-CM | POA: Diagnosis not present

## 2019-07-20 DIAGNOSIS — R972 Elevated prostate specific antigen [PSA]: Secondary | ICD-10-CM | POA: Diagnosis not present

## 2019-07-20 DIAGNOSIS — Z87442 Personal history of urinary calculi: Secondary | ICD-10-CM | POA: Diagnosis not present

## 2019-07-20 DIAGNOSIS — N401 Enlarged prostate with lower urinary tract symptoms: Secondary | ICD-10-CM | POA: Diagnosis not present

## 2019-07-20 DIAGNOSIS — R351 Nocturia: Secondary | ICD-10-CM | POA: Diagnosis not present

## 2019-11-14 DIAGNOSIS — E7849 Other hyperlipidemia: Secondary | ICD-10-CM | POA: Diagnosis not present

## 2019-11-14 DIAGNOSIS — I10 Essential (primary) hypertension: Secondary | ICD-10-CM | POA: Diagnosis not present

## 2019-11-14 DIAGNOSIS — Z0000 Encounter for general adult medical examination without abnormal findings: Secondary | ICD-10-CM | POA: Diagnosis not present

## 2019-11-14 DIAGNOSIS — Z125 Encounter for screening for malignant neoplasm of prostate: Secondary | ICD-10-CM | POA: Diagnosis not present

## 2019-11-16 DIAGNOSIS — E7849 Other hyperlipidemia: Secondary | ICD-10-CM | POA: Diagnosis not present

## 2019-11-16 DIAGNOSIS — R82998 Other abnormal findings in urine: Secondary | ICD-10-CM | POA: Diagnosis not present

## 2019-11-21 DIAGNOSIS — E785 Hyperlipidemia, unspecified: Secondary | ICD-10-CM | POA: Diagnosis not present

## 2019-11-21 DIAGNOSIS — M545 Low back pain: Secondary | ICD-10-CM | POA: Diagnosis not present

## 2019-11-21 DIAGNOSIS — Z0000 Encounter for general adult medical examination without abnormal findings: Secondary | ICD-10-CM | POA: Diagnosis not present

## 2019-11-21 DIAGNOSIS — I10 Essential (primary) hypertension: Secondary | ICD-10-CM | POA: Diagnosis not present

## 2019-11-21 DIAGNOSIS — R972 Elevated prostate specific antigen [PSA]: Secondary | ICD-10-CM | POA: Diagnosis not present

## 2019-11-23 DIAGNOSIS — Z1212 Encounter for screening for malignant neoplasm of rectum: Secondary | ICD-10-CM | POA: Diagnosis not present

## 2019-12-22 DIAGNOSIS — R7301 Impaired fasting glucose: Secondary | ICD-10-CM | POA: Diagnosis not present

## 2019-12-22 DIAGNOSIS — I10 Essential (primary) hypertension: Secondary | ICD-10-CM | POA: Diagnosis not present

## 2020-05-01 DIAGNOSIS — H903 Sensorineural hearing loss, bilateral: Secondary | ICD-10-CM | POA: Diagnosis not present

## 2020-05-01 DIAGNOSIS — H938X1 Other specified disorders of right ear: Secondary | ICD-10-CM | POA: Diagnosis not present

## 2020-06-07 DIAGNOSIS — R7301 Impaired fasting glucose: Secondary | ICD-10-CM | POA: Diagnosis not present

## 2020-06-07 DIAGNOSIS — Z125 Encounter for screening for malignant neoplasm of prostate: Secondary | ICD-10-CM | POA: Diagnosis not present

## 2020-06-07 DIAGNOSIS — Z0000 Encounter for general adult medical examination without abnormal findings: Secondary | ICD-10-CM | POA: Diagnosis not present

## 2020-06-14 DIAGNOSIS — D2262 Melanocytic nevi of left upper limb, including shoulder: Secondary | ICD-10-CM | POA: Diagnosis not present

## 2020-06-14 DIAGNOSIS — L570 Actinic keratosis: Secondary | ICD-10-CM | POA: Diagnosis not present

## 2020-06-14 DIAGNOSIS — D225 Melanocytic nevi of trunk: Secondary | ICD-10-CM | POA: Diagnosis not present

## 2020-06-14 DIAGNOSIS — L821 Other seborrheic keratosis: Secondary | ICD-10-CM | POA: Diagnosis not present

## 2020-06-14 DIAGNOSIS — L814 Other melanin hyperpigmentation: Secondary | ICD-10-CM | POA: Diagnosis not present

## 2020-07-02 DIAGNOSIS — H25013 Cortical age-related cataract, bilateral: Secondary | ICD-10-CM | POA: Diagnosis not present

## 2020-07-02 DIAGNOSIS — H5203 Hypermetropia, bilateral: Secondary | ICD-10-CM | POA: Diagnosis not present

## 2020-08-15 DIAGNOSIS — R3914 Feeling of incomplete bladder emptying: Secondary | ICD-10-CM | POA: Diagnosis not present

## 2020-08-15 DIAGNOSIS — N401 Enlarged prostate with lower urinary tract symptoms: Secondary | ICD-10-CM | POA: Diagnosis not present

## 2020-08-20 DIAGNOSIS — N401 Enlarged prostate with lower urinary tract symptoms: Secondary | ICD-10-CM | POA: Diagnosis not present

## 2020-08-20 DIAGNOSIS — R3914 Feeling of incomplete bladder emptying: Secondary | ICD-10-CM | POA: Diagnosis not present

## 2020-08-20 DIAGNOSIS — R972 Elevated prostate specific antigen [PSA]: Secondary | ICD-10-CM | POA: Diagnosis not present

## 2020-08-20 DIAGNOSIS — R351 Nocturia: Secondary | ICD-10-CM | POA: Diagnosis not present

## 2020-09-08 DIAGNOSIS — Z20822 Contact with and (suspected) exposure to covid-19: Secondary | ICD-10-CM | POA: Diagnosis not present

## 2020-09-24 DIAGNOSIS — Z20822 Contact with and (suspected) exposure to covid-19: Secondary | ICD-10-CM | POA: Diagnosis not present

## 2020-09-24 DIAGNOSIS — Z03818 Encounter for observation for suspected exposure to other biological agents ruled out: Secondary | ICD-10-CM | POA: Diagnosis not present

## 2020-10-02 DIAGNOSIS — L820 Inflamed seborrheic keratosis: Secondary | ICD-10-CM | POA: Diagnosis not present

## 2020-10-18 DIAGNOSIS — H903 Sensorineural hearing loss, bilateral: Secondary | ICD-10-CM | POA: Diagnosis not present

## 2020-11-19 DIAGNOSIS — Z125 Encounter for screening for malignant neoplasm of prostate: Secondary | ICD-10-CM | POA: Diagnosis not present

## 2020-11-19 DIAGNOSIS — E785 Hyperlipidemia, unspecified: Secondary | ICD-10-CM | POA: Diagnosis not present

## 2020-11-19 DIAGNOSIS — R7301 Impaired fasting glucose: Secondary | ICD-10-CM | POA: Diagnosis not present

## 2020-11-26 DIAGNOSIS — Z0000 Encounter for general adult medical examination without abnormal findings: Secondary | ICD-10-CM | POA: Diagnosis not present

## 2020-11-26 DIAGNOSIS — R972 Elevated prostate specific antigen [PSA]: Secondary | ICD-10-CM | POA: Diagnosis not present

## 2020-11-27 ENCOUNTER — Other Ambulatory Visit: Payer: Self-pay | Admitting: Internal Medicine

## 2020-11-27 DIAGNOSIS — R17 Unspecified jaundice: Secondary | ICD-10-CM

## 2020-11-27 DIAGNOSIS — K921 Melena: Secondary | ICD-10-CM | POA: Diagnosis not present

## 2020-11-27 DIAGNOSIS — R82998 Other abnormal findings in urine: Secondary | ICD-10-CM | POA: Diagnosis not present

## 2020-12-14 DIAGNOSIS — L820 Inflamed seborrheic keratosis: Secondary | ICD-10-CM | POA: Diagnosis not present

## 2020-12-17 ENCOUNTER — Ambulatory Visit
Admission: RE | Admit: 2020-12-17 | Discharge: 2020-12-17 | Disposition: A | Discharge status: Home / Self Care | Payer: BC Managed Care – PPO | Source: Ambulatory Visit | Attending: Internal Medicine | Admitting: Internal Medicine

## 2020-12-17 DIAGNOSIS — K760 Fatty (change of) liver, not elsewhere classified: Secondary | ICD-10-CM | POA: Diagnosis not present

## 2020-12-17 DIAGNOSIS — N281 Cyst of kidney, acquired: Secondary | ICD-10-CM | POA: Diagnosis not present

## 2020-12-17 DIAGNOSIS — R17 Unspecified jaundice: Secondary | ICD-10-CM

## 2020-12-17 DIAGNOSIS — K824 Cholesterolosis of gallbladder: Secondary | ICD-10-CM | POA: Diagnosis not present

## 2020-12-17 NOTE — Progress Notes (Signed)
Cardiology Office Note   Date:  12/21/2020   ID:  Ian, Burns [birth year], MRN [id number]  PCP:  Ricard Dillon, MD  Cardiologist:   Peter Martinique, MD   Chief Complaint  Patient presents with  . Shortness of Breath      History of Present Illness: Ian Burns is a 70 y.o. male who is seen at the request of Dr Philip Aspen for evaluation of dyspnea on exertion. He has a history of HTN and HLD. He reports he gained 15 lbs in the last year or so. He was previously working out at the gym 3-4 days a week and used to ride horses competitively. Since Covid he does exercise at home but not as much. Notes decreased exercise tolerance and mild dyspnea. No chest pain. Splits time between here and [place] with his work in [employer]. Reports having a ETT with his doctor in [place] about 5 years ago.   Past Medical History:  Diagnosis Date  . HYPERLIPIDEMIA 06/30/2007  . HYPERTENSION 06/30/2007  . OSTEOARTHRITIS, MODERATE 10/04/2009  . PROSTATE SPECIFIC ANTIGEN, ELEVATED 07/16/2009  . UNSPECIFIED OSTEOPOROSIS 10/04/2009    History reviewed. No pertinent surgical history.   Current Outpatient Medications  Medication Sig Dispense Refill  . Azelaic Acid 15 % cream     . Coenzyme Q10 (COQ10) 200 MG CAPS Take 1 capsule by mouth daily.    . Cyanocobalamin (B-12) 1000 MCG CAPS Take 1 capsule by mouth daily.    Marland Kitchen desonide (DESOWEN) 0.05 % lotion     . ezetimibe (ZETIA) 10 MG tablet Take 1 tablet by mouth daily.    . finasteride (PROSCAR) 5 MG tablet     . fluocinolone (SYNALAR) 0.025 % cream Apply topically.    . Fluticasone-Salmeterol (ADVAIR DISKUS) 250-50 MCG/DOSE AEPB Inhale 1 puff into the lungs every 12 (twelve) hours. 90 each 3  . Glucosamine HCl 1000 MG TABS Take 1,000 mg by mouth daily.    . Glucosamine Sulfate 1000 MG TABS Take by mouth.    Marland Kitchen glucosamine-chondroitin 500-400 MG tablet Take 1 tablet by mouth daily.    . irbesartan (AVAPRO) 300 MG tablet     . montelukast  (SINGULAIR) 10 MG tablet Take 1 tablet by mouth daily.    . nebivolol (BYSTOLIC) 5 MG tablet Take 0.5 tablets by mouth daily.    . rosuvastatin (CRESTOR) 20 MG tablet Take 1 tablet by mouth daily.    . tamsulosin (FLOMAX) 0.4 MG CAPS capsule Take 2 capsules by mouth daily.    . Turmeric 450 MG CAPS 2 caps daily     No current facility-administered medications for this visit.    Allergies:   Patient has no known allergies.    Social History:  The patient  reports that he has quit smoking. His smoking use included cigars. He has quit using smokeless tobacco. He reports current alcohol use. He reports that he does not use drugs.   Family History:  The patient's family history includes Alzheimer's disease in his mother; Lung cancer in his maternal grandfather; Lung disease in his father; Lymphoma in his sister.    ROS:  Please see the history of present illness.   Otherwise, review of systems are positive for none.   All other systems are reviewed and negative.    PHYSICAL EXAM: VS:  BP 126/82   Pulse 71   Ht 5\' 7"  (1.702 m)   Wt 191 lb 12.8 oz (87 kg)  SpO2 98%   BMI 30.04 kg/m  , BMI Body mass index is 30.04 kg/m. GEN: Well nourished, obese, in no acute distress  HEENT: normal  Neck: no JVD, carotid bruits, or masses Cardiac: RRR; no murmurs, rubs, or gallops,no edema  Respiratory:  clear to auscultation bilaterally, normal work of breathing GI: soft, nontender, nondistended, + BS MS: no deformity or atrophy  Skin: warm and dry, no rash Neuro:  Strength and sensation are intact Psych: euthymic mood, full affect   EKG:  EKG is ordered today. The ekg ordered today demonstrates NSR with rate 71. First degree AV block. Otherwise normal.    Recent Labs: No results found for requested labs within last 8760 hours.    Lipid Panel    Component Value Date/Time   CHOL 154 06/20/2013 0936   TRIG 50.0 06/20/2013 0936   HDL 62.80 06/20/2013 0936   CHOLHDL 2 06/20/2013 0936    VLDL 10.0 06/20/2013 0936   LDLCALC 81 06/20/2013 0936   LDLDIRECT 74.8 06/20/2013 0936    Dated 11/19/20: cholesterol 138, triglycerides 115, HDL 56, LDL 59. A1c 5.5%. CMET and CBC normal  Wt Readings from Last 3 Encounters:  12/21/20 191 lb 12.8 oz (87 kg)  06/20/13 182 lb (82.6 kg)  12/06/12 182 lb (82.6 kg)      Other studies Reviewed: Additional studies/ records that were reviewed today include: None   ASSESSMENT AND PLAN:  1.  Dyspnea and decreased exercise tolerance. This may be more related to weight gain but he is at higher risk for CAD with Obesity, HTN, HLD. We discussed ischemic work up including stress testing versus coronary CTA. After discussion we decided that CTA would be the best way to assess. Will schedule 2. HTN controlled 3. HLD. Excellent control on Crestor and Zetia 4. Obesity.   Current medicines are reviewed at length with the patient today.  The patient does not have concerns regarding medicines.  The following changes have been made:  no change  Labs/ tests ordered today include:   Orders Placed This Encounter  Procedures  . CT CORONARY MORPH W/CTA COR W/SCORE W/CA W/CM &/OR WO/CM  . CT CORONARY FRACTIONAL FLOW RESERVE DATA PREP  . CT CORONARY FRACTIONAL FLOW RESERVE FLUID ANALYSIS  . EKG 12-Lead     Disposition:   FU TBD Signed, Peter Martinique, MD  12/21/2020 9:44 AM    [facility] [address], [place], [place], [address] Phone [phone], Fax [phone]

## 2020-12-21 ENCOUNTER — Other Ambulatory Visit: Payer: Self-pay

## 2020-12-21 ENCOUNTER — Ambulatory Visit (INDEPENDENT_AMBULATORY_CARE_PROVIDER_SITE_OTHER): Payer: BC Managed Care – PPO | Admitting: Cardiology

## 2020-12-21 ENCOUNTER — Encounter: Payer: Self-pay | Admitting: Cardiology

## 2020-12-21 VITALS — BP 126/82 | HR 71 | Ht 67.0 in | Wt 191.8 lb

## 2020-12-21 DIAGNOSIS — R0600 Dyspnea, unspecified: Secondary | ICD-10-CM

## 2020-12-21 DIAGNOSIS — I10 Essential (primary) hypertension: Secondary | ICD-10-CM

## 2020-12-21 DIAGNOSIS — E7800 Pure hypercholesterolemia, unspecified: Secondary | ICD-10-CM

## 2020-12-21 DIAGNOSIS — R0609 Other forms of dyspnea: Secondary | ICD-10-CM

## 2020-12-21 MED ORDER — METOPROLOL TARTRATE 50 MG PO TABS: ORAL_TABLET | ORAL | 0 refills | Status: AC

## 2020-12-21 NOTE — Patient Instructions (Addendum)
Medication Instructions:  Continue same medications   Lab Work: None ordered   Testing/Procedures: Coronary Ct  Will be scheduled after approved by insurance  Follow instructions below   Follow-Up: At [facility], you and your health needs are our priority.  As part of our continuing mission to provide you with exceptional heart care, we have created designated Provider Care Teams.  These Care Teams include your primary Cardiologist (physician) and Advanced Practice Providers (APPs -  Physician Assistants and Nurse Practitioners) who all work together to provide you with the care you need, when you need it.  We recommend signing up for the patient portal called "MyChart".  Sign up information is provided on this After Visit Summary.  MyChart is used to connect with patients for Virtual Visits (Telemedicine).  Patients are able to view lab/test results, encounter notes, upcoming appointments, etc.  Non-urgent messages can be sent to your provider as well.   To learn more about what you can do with MyChart, go to [email or web address].    Your next appointment:  To Be Determined   The format for your next appointment: Office   Provider:  Dr.Jordan     Your cardiac CT will be scheduled at one of the below locations:   [facility] [address] [place], [place] [address] [phone]  [facility] [address] [place], [place] [address] [phone]  If scheduled at [facility], please arrive at the [facility] main entrance (entrance A) of [facility] 30 minutes prior to test start time. Proceed to the [facility] Radiology Department (first floor) to check-in and test prep.  If scheduled at [facility], please arrive 15 mins early for check-in and test prep.  Please follow these instructions carefully (unless otherwise directed):  Hold all erectile dysfunction  medications at least 3 days (72 hrs) prior to test.  On the Night Before the Test: . Be sure to Drink plenty of water. . Do not consume any caffeinated/decaffeinated beverages or chocolate 12 hours prior to your test. . Do not take any antihistamines 12 hours prior to your test.  On the Day of the Test: . Drink plenty of water until 1 hour prior to the test. . Do not eat any food 4 hours prior to the test. . You may take your regular medications prior to the test.  . Take metoprolol 50 mg two hours prior to test       HOLD BYSTOLIC MORNING OF TEST        After the Test: . Drink plenty of water. . After receiving IV contrast, you may experience a mild flushed feeling. This is normal. . On occasion, you may experience a mild rash up to 24 hours after the test. This is not dangerous. If this occurs, you can take Benadryl 25 mg and increase your fluid intake. . If you experience trouble breathing, this can be serious. If it is severe call 911 IMMEDIATELY. If it is mild, please call our office.    Once we have confirmed authorization from your insurance company, we will call you to set up a date and time for your test. Based on how quickly your insurance processes prior authorizations requests, please allow up to 4 weeks to be contacted for scheduling your Cardiac CT appointment. Be advised that routine Cardiac CT appointments could be scheduled as many as 8 weeks after your provider has ordered it.  For  non-scheduling related questions, please contact the cardiac imaging nurse navigator should you have any questions/concerns: Marchia Bond, Cardiac Imaging Nurse Navigator Gordy Clement, Cardiac Imaging Nurse Navigator [facility] Direct Office Dial: [phone]   For scheduling needs, including cancellations and rescheduling, please call [place], [phone].

## 2020-12-21 NOTE — Addendum Note (Signed)
Addended by: Kathyrn Lass on: 12/21/2020 09:51 AM   Modules accepted: Orders

## 2021-01-02 ENCOUNTER — Other Ambulatory Visit: Payer: Self-pay

## 2021-01-02 DIAGNOSIS — I10 Essential (primary) hypertension: Secondary | ICD-10-CM

## 2021-01-02 DIAGNOSIS — Z01812 Encounter for preprocedural laboratory examination: Secondary | ICD-10-CM

## 2021-01-07 LAB — BASIC METABOLIC PANEL
BUN/Creatinine Ratio: 24 (ref 10–24)
BUN: 20 mg/dL (ref 8–27)
CO2: 22 mmol/L (ref 20–29)
Calcium: 9.6 mg/dL (ref 8.6–10.2)
Chloride: 103 mmol/L (ref 96–106)
Creatinine, Ser: 0.83 mg/dL (ref 0.76–1.27)
Glucose: 117 mg/dL — ABNORMAL HIGH (ref 65–99)
Potassium: 4.7 mmol/L (ref 3.5–5.2)
Sodium: 139 mmol/L (ref 134–144)
eGFR: 94 mL/min/{1.73_m2} (ref >59)

## 2021-01-09 ENCOUNTER — Telehealth (HOSPITAL_COMMUNITY): Payer: Self-pay | Admitting: Emergency Medicine

## 2021-01-09 NOTE — Telephone Encounter (Signed)
Attempted to call patient regarding upcoming cardiac CT appointment. °Left message on voicemail with name and callback number °Wren Gallaga RN Navigator Cardiac Imaging °[facility] °[phone] Office °[phone] Cell ° °

## 2021-01-09 NOTE — Telephone Encounter (Signed)
Pt retuning phone call regarding upcoming cardiac imaging study; pt verbalizes understanding of appt date/time, parking situation and where to check in, pre-test NPO status and medications ordered, and verified current allergies; name and call back number provided for further questions should they arise Marchia Bond RN Navigator Cardiac Imaging [facility] [phone] office [phone] cell   Pt taking 50mg  metoprolol tartrate Holding irbesartan and bystolic day of scan Clarise Cruz

## 2021-01-11 ENCOUNTER — Encounter: Payer: BC Managed Care – PPO | Admitting: *Deleted

## 2021-01-11 ENCOUNTER — Other Ambulatory Visit: Payer: Self-pay

## 2021-01-11 ENCOUNTER — Encounter (HOSPITAL_COMMUNITY): Payer: Self-pay

## 2021-01-11 ENCOUNTER — Ambulatory Visit (HOSPITAL_COMMUNITY)
Admission: RE | Admit: 2021-01-11 | Discharge: 2021-01-11 | Disposition: A | Discharge status: Home / Self Care | Payer: BC Managed Care – PPO | Source: Ambulatory Visit | Attending: Cardiology | Admitting: Cardiology

## 2021-01-11 DIAGNOSIS — E7800 Pure hypercholesterolemia, unspecified: Secondary | ICD-10-CM | POA: Insufficient documentation

## 2021-01-11 DIAGNOSIS — I10 Essential (primary) hypertension: Secondary | ICD-10-CM

## 2021-01-11 DIAGNOSIS — I700 Atherosclerosis of aorta: Secondary | ICD-10-CM | POA: Diagnosis not present

## 2021-01-11 DIAGNOSIS — R0600 Dyspnea, unspecified: Secondary | ICD-10-CM | POA: Diagnosis not present

## 2021-01-11 DIAGNOSIS — R0609 Other forms of dyspnea: Secondary | ICD-10-CM

## 2021-01-11 DIAGNOSIS — Z006 Encounter for examination for normal comparison and control in clinical research program: Secondary | ICD-10-CM

## 2021-01-11 MED ORDER — NITROGLYCERIN 0.4 MG SL SUBL: 0.8000 mg | SUBLINGUAL_TABLET | Freq: Once | SUBLINGUAL | Status: AC

## 2021-01-11 MED ORDER — IOHEXOL 350 MG/ML SOLN
80.0000 mL | Freq: Once | INTRAVENOUS | Status: AC | PRN
  Administered 2021-01-11: 80 mL via INTRAVENOUS

## 2021-01-11 MED ORDER — NITROGLYCERIN 0.4 MG SL SUBL
SUBLINGUAL_TABLET | SUBLINGUAL | Status: AC
  Administered 2021-01-11: 0.8 mg via SUBLINGUAL
  Filled 2021-01-11: qty 1

## 2021-01-11 NOTE — Research (Signed)
IDENTIFY Informed Consent                  Subject Name:   Ian Burns   Subject met inclusion and exclusion criteria.  The informed consent form, study requirements and expectations were reviewed with the subject and questions and concerns were addressed prior to the signing of the consent form.  The subject verbalized understanding of the trial requirements.  The subject agreed to participate in the IDENTIFY trial and signed the informed consent.  The informed consent was obtained prior to performance of any protocol-specific procedures for the subject.  A copy of the signed informed consent was given to the subject and a copy was placed in the subject's medical record. This patient was consented by Sallye Ober on 01-11-2021 at 08:18 a.m.   [place] Jaine Estabrooks, Research Assistant 01/11/2021  08:18 a.m.

## 2021-01-14 NOTE — Telephone Encounter (Signed)
Spoke to patient coronary ct results given. 

## 2021-01-16 ENCOUNTER — Telehealth: Payer: Self-pay

## 2021-01-16 NOTE — Telephone Encounter (Signed)
Called patient left message on personal voice mail he is to follow up with Dr.Jordan as needed.

## 2021-01-22 ENCOUNTER — Ambulatory Visit: Payer: BC Managed Care – PPO | Admitting: Gastroenterology

## 2021-01-22 ENCOUNTER — Other Ambulatory Visit: Payer: Self-pay

## 2021-01-22 ENCOUNTER — Other Ambulatory Visit (INDEPENDENT_AMBULATORY_CARE_PROVIDER_SITE_OTHER): Payer: BC Managed Care – PPO

## 2021-01-22 ENCOUNTER — Encounter: Payer: Self-pay | Admitting: Gastroenterology

## 2021-01-22 VITALS — BP 130/78 | HR 96 | Ht 67.0 in | Wt 193.0 lb

## 2021-01-22 DIAGNOSIS — K824 Cholesterolosis of gallbladder: Secondary | ICD-10-CM

## 2021-01-22 DIAGNOSIS — R195 Other fecal abnormalities: Secondary | ICD-10-CM

## 2021-01-22 DIAGNOSIS — K760 Fatty (change of) liver, not elsewhere classified: Secondary | ICD-10-CM

## 2021-01-22 DIAGNOSIS — N281 Cyst of kidney, acquired: Secondary | ICD-10-CM

## 2021-01-22 DIAGNOSIS — R17 Unspecified jaundice: Secondary | ICD-10-CM

## 2021-01-22 LAB — BASIC METABOLIC PANEL
BUN: 19 mg/dL (ref 6–23)
CO2: 28 mEq/L (ref 19–32)
Calcium: 9.6 mg/dL (ref 8.4–10.5)
Chloride: 103 mEq/L (ref 96–112)
Creatinine, Ser: 0.79 mg/dL (ref 0.40–1.50)
GFR: 89.27 mL/min (ref >60.00)
Glucose, Bld: 118 mg/dL — ABNORMAL HIGH (ref 70–99)
Potassium: 4.2 mEq/L (ref 3.5–5.1)
Sodium: 138 mEq/L (ref 135–145)

## 2021-01-22 LAB — HEPATIC FUNCTION PANEL
ALT: 26 U/L (ref 0–53)
AST: 25 U/L (ref 0–37)
Albumin: 4.4 g/dL (ref 3.5–5.2)
Alkaline Phosphatase: 55 U/L (ref 39–117)
Bilirubin, Direct: 0.2 mg/dL (ref 0.0–0.3)
Total Bilirubin: 1 mg/dL (ref 0.2–1.2)
Total Protein: 7.2 g/dL (ref 6.0–8.3)

## 2021-01-22 MED ORDER — SUTAB 1479-225-188 MG PO TABS: 1.0000 | ORAL_TABLET | Freq: Once | ORAL | 0 refills | Status: AC

## 2021-01-22 NOTE — Progress Notes (Signed)
HPI :  70 year old male with a history of diverticulosis, fatty liver, gallbladder polyp, renal cyst, referred by Dr. Bevelyn Buckles to discuss positive stool test and imaging findings.  The patient was last seen by our office in 2006.  He has lived in both [place] and [place] over the past several years.  He had a colonoscopy with our office in 2006 which showed mild diverticulosis and hemorrhoids, no polyps.  He had another colonoscopy in [place] in August 2015, reviewed report with him today, showed diverticulosis and otherwise no polyps.  He has no anemia.  No blood in his stools.  Normal bowel movements.  No abdominal pains.  No family history of colon cancer.  He had a Hemosure test that was sent this past February which was positive.  We discussed what that meant and options to evaluate this.  He otherwise was noted to have an elevated bilirubin level of 1.7 by his primary care.  This led to an evaluation with an ultrasound which showed hepatic steatosis, 5 mm gallbladder polyp, and a 3.7 cm left complex cystic lesion.  Follow-up CT scan or MRI was recommended to evaluate the renal lesion, he states he has not had that done yet.  He denies any blood in his urine or flank pain.  He denies any right upper quadrant pain.  He states since COVID has happened he has been more sedentary and gained 12 pounds over the past year.  He has a few alcoholic beverages per week but does not drink on a routine basis.  Since he was found to have fatty liver he stated he has started a diet and has lost about 4 pounds or so since doing that and needs doing better in that regard.  He is very active and plays golf frequently.  He denies any cardiopulmonary symptoms.  His AST and ALT have been normal, as well as alkaline phosphatase.  He has had a recent cardiac CT which has looked okay.  Prior work-up: (+) Hemosure - 11/27/20  T bil 1.7, AST 27, ALT 29, AP 41  Hgb 15.5, plt 210, MCV  95.2   Colonoscopy 06/02/2005 - mild diverticulosis, hemorrhoids  [place]on[place] 05/2014 - [place] ([place]  Coronary CT 01/11/21 - IMPRESSION: 1. Coronary calcium score of 37. This was 28th percentile for age, sex, and race matched control.  2. Normal coronary origin with right dominance.  3. CAD-RADS 1. Minimal non-obstructive CAD (1-24%). Consider non-atherosclerotic causes of chest pain. Consider preventive therapy and risk factor modification.  4. Atypical pulmonary vein drainage into the left atrium: common right pulmonary vein.  5. Small PFO noted.  6. Aortic arch atherosclerosis noted.   US abdomen 12/18/20 - IMPRESSION: Hepatic steatosis. 5 mm gallbladder wall polyp.  No stones or acute cholecystitis. 3.7 cm complex cystic lesion in the lower pole of the left kidney. Recommend follow-up with repeat ultrasound in 6 months to assess stability or further characterisation with CT or MRI.   Past Medical History:  Diagnosis Date  . Allergies   . Asthma   . Degenerative disc disease, cervical   . Diverticulosis   . HYPERLIPIDEMIA 06/30/2007  . HYPERTENSION 06/30/2007  . OSTEOARTHRITIS, MODERATE 10/04/2009  . PROSTATE SPECIFIC ANTIGEN, ELEVATED 07/16/2009  . UNSPECIFIED OSTEOPOROSIS 10/04/2009     Past Surgical History:  Procedure Laterality Date  . COLONOSCOPY  2015   [place]   . NASAL SEPTUM SURGERY     rhinoplasty   Family History  Problem  Relation Age of Onset  . Alzheimer's disease Mother   . Hypertension Mother   . Hyperlipidemia Mother   . Lung disease Father   . Hypertension Father   . Diabetes Father   . COPD Father   . Lymphoma Sister   . Lung cancer Maternal Grandfather   . Prostate cancer Neg Hx    Social History   Tobacco Use  . Smoking status: Former Smoker    Types: Cigars  . Smokeless tobacco: Former Systems developer  . Tobacco comment: smokes 5-6 cigars per year  Substance Use Topics  . Alcohol use: Yes  . Drug use: No   Current Outpatient  Medications  Medication Sig Dispense Refill  . Azelaic Acid 15 % cream     . Coenzyme Q10 (COQ10) 200 MG CAPS Take 1 capsule by mouth daily.    . Cyanocobalamin (B-12) 1000 MCG CAPS Take 1 capsule by mouth daily.    Marland Kitchen desonide (DESOWEN) 0.05 % lotion     . ezetimibe (ZETIA) 10 MG tablet Take 1 tablet by mouth daily.    . finasteride (PROSCAR) 5 MG tablet     . fluocinolone (SYNALAR) 0.025 % cream Apply topically.    . Fluticasone-Salmeterol (ADVAIR DISKUS) 250-50 MCG/DOSE AEPB Inhale 1 puff into the lungs every 12 (twelve) hours. 90 each 3  . Glucosamine HCl 1000 MG TABS Take 1,000 mg by mouth daily.    . Glucosamine Sulfate 1000 MG TABS Take by mouth.    Marland Kitchen glucosamine-chondroitin 500-400 MG tablet Take 1 tablet by mouth daily.    . irbesartan (AVAPRO) 300 MG tablet     . metoprolol tartrate (LOPRESSOR) 50 MG tablet Take 50 mg   2 hours before Coronary CT 1 tablet 0  . montelukast (SINGULAIR) 10 MG tablet Take 1 tablet by mouth daily.    . nebivolol (BYSTOLIC) 5 MG tablet Take 0.5 tablets by mouth daily.    . rosuvastatin (CRESTOR) 20 MG tablet Take 1 tablet by mouth daily.    . tamsulosin (FLOMAX) 0.4 MG CAPS capsule Take 2 capsules by mouth daily.    . Turmeric 450 MG CAPS 2 caps daily     No current facility-administered medications for this visit.   No Known Allergies   Review of Systems: All systems reviewed and negative except where noted in HPI.    CT CORONARY MORPH W/CTA COR W/SCORE W/CA W/CM &/OR WO/CM  Addendum Date: 01/11/2021   ADDENDUM REPORT: 01/11/2021 15:21 CLINICAL DATA:  70 Year old White Male EXAM: Cardiac/Coronary  CTA TECHNIQUE: The patient was scanned on a [employer]. FINDINGS: A 100 kV prospective scan was triggered in the descending thoracic aorta at 111 HU's. Axial non-contrast 3 mm slices were carried out through the heart. The data set was analyzed on a dedicated work station and scored using the Mason. Gantry rotation speed was 250 msecs  and collimation was .6 mm. No beta blockade and 0.8 mg of sl NTG was given. The 3D data set was reconstructed in 5% intervals of the 67-82 % of the R-R cycle. Diastolic phases were analyzed on a dedicated work station using MPR, MIP and VRT modes. The patient received 80 cc of contrast. Aorta: Normal size. Aortic arch atherosclerosis noted. No dissection. Aortic Valve:  Tri-leaflet.  No calcifications. Coronary Arteries:  Normal coronary origin.  Right dominance. Coronary Calcium Score: Left main: 0 Left anterior descending artery: 35 Left circumflex artery: 0 Right coronary artery: 2 Total: 37 Percentile: 28th for age,  sex, and race matched control. RCA is a large dominant artery that gives rise to PDA and PLA. There is a minimal non-obstructive (1-24%) calcified plaque in the mid vessel. Left main is a large artery that gives rise to LAD and LCX arteries. There is no plaque. LAD is a large vessel that gives rise to a small D1 vessel and a larger D2 branch that gives off smaller vessels. Small D3 vessel. There is a minimal non-obstructive (1-24%) calcified plaque in the mid vessel. There is a minimal non-obstructive (1-24%) calcified plaque in the distal vessel. LCX is a non-dominant artery that gives bifurcated into an OM1 vessel. There is no plaque. There is a ramus intermedius vessel with no plaque. Other findings: Atypical pulmonary vein drainage into the left atrium: common right pulmonary vein. Normal left atrial appendage without a thrombus. Normal size of the pulmonary artery. Small PFO noted. Extra-cardiac findings: See attached radiology report for non-cardiac structures. IMPRESSION: 1. Coronary calcium score of 37. This was 28th percentile for age, sex, and race matched control. 2. Normal coronary origin with right dominance. 3. CAD-RADS 1. Minimal non-obstructive CAD (1-24%). Consider non-atherosclerotic causes of chest pain. Consider preventive therapy and risk factor modification. 4. Atypical  pulmonary vein drainage into the left atrium: common right pulmonary vein. 5. Small PFO noted. 6. Aortic arch atherosclerosis noted. RECOMMENDATIONS: Coronary artery calcium (CAC) score is a strong predictor of incident coronary heart disease (CHD) and provides predictive information beyond traditional risk factors. CAC scoring is reasonable to use in the decision to withhold, postpone, or initiate statin therapy in intermediate-risk or selected borderline-risk asymptomatic adults (age 47-75 years and LDL-C ?70 to <190 mg/dL) who do not have diabetes or established atherosclerotic cardiovascular disease (ASCVD).* In intermediate-risk (10-year ASCVD risk ?7.5% to <20%) adults or selected borderline-risk (10-year ASCVD risk ?5% to <7.5%) adults in whom a CAC score is measured for the purpose of making a treatment decision the following recommendations have been made: If CAC = 0, it is reasonable to withhold statin therapy and reassess in 5 to 10 years, as long as higher risk conditions are absent (diabetes mellitus, family history of premature CHD in first degree relatives (males <55 years; females <65 years), cigarette smoking, LDL ?190 mg/dL or other independent risk factors). If CAC is 1 to 99, it is reasonable to initiate statin therapy for patients ?72 years of age. If CAC is ?100 or ?75th percentile, it is reasonable to initiate statin therapy at any age. Cardiology referral should be considered for patients with CAC scores ?400 or ?75th percentile. *2018 AHA/ACC/AACVPR/AAPA/ABC/ACPM/ADA/AGS/APhA/ASPC/NLA/PCNA Guideline on the Management of Blood Cholesterol: A Report of the American College of Cardiology/American Heart Association Task Force on Clinical Practice Guidelines. J Am Coll Cardiol. 2019;73(24):3168-3209. Rudean Haskell, MD Electronically Signed   By: Rudean Haskell MD   On: 01/11/2021 15:21   Result Date: 01/11/2021 EXAM: OVER-READ INTERPRETATION  CT CHEST The following report is an  over-read performed by radiologist Dr. Vinnie Langton of [facility], [place] on 01/11/2021. This over-read does not include interpretation of cardiac or coronary anatomy or pathology. The coronary calcium score/coronary CTA interpretation by the cardiologist is attached. COMPARISON:  No priors. FINDINGS: Aortic atherosclerosis. Within the visualized portions of the thorax there are no suspicious appearing pulmonary nodules or masses, there is no acute consolidative airspace disease, no pleural effusions, no pneumothorax and no lymphadenopathy. Visualized portions of the upper abdomen are unremarkable. There are no aggressive appearing lytic or blastic lesions noted in the  visualized portions of the skeleton. Several old healed left-sided rib fractures. IMPRESSION: 1.  Aortic Atherosclerosis (ICD10-I70.0). Electronically Signed: By: Vinnie Langton M.D. On: 01/11/2021 10:09    Physical Exam: BP 130/78   Pulse 96   Ht 5\' 7"  (1.702 m)   Wt 193 lb (87.5 kg)   BMI 30.23 kg/m  Constitutional: Pleasant,well-developed, male in no acute distress. HEENT: Normocephalic and atraumatic. Conjunctivae are normal. No scleral icterus. Neck supple.  Cardiovascular: Normal rate, regular rhythm.  Pulmonary/chest: Effort normal and breath sounds normal.  Abdominal: Soft, nondistended, nontender. There are no masses palpable. No hepatomegaly. Extremities: no edema Lymphadenopathy: No cervical adenopathy noted. Neurological: Alert and oriented to person place and time. Skin: Skin is warm and dry. No rashes noted. Psychiatric: Normal mood and affect. Behavior is normal.   ASSESSMENT AND PLAN: 70 year old male here for new patient assessment of the following:  Heme positive stool Fatty liver Gallbladder polyp Elevated bilirubin Renal cyst  Patient has no history of colon polyps, no family history of colon cancer, and is asymptomatic, had a Hemosure test come back positive.  We discussed differential  diagnosis for that.  Given his last colonoscopy was in 2015 I recommend optical colonoscopy to evaluate this finding.  I discussed risk benefits of colonoscopy and anesthesia with him and he wants to proceed.  Otherwise we discussed his imaging findings.  He does have hepatic steatosis noted on ultrasound, he does not drink alcohol routinely.  His liver enzymes are normal other than a mild elevation in bilirubin.  We discussed spectrum of fatty liver disease and risk for cirrhosis.  Ultimately weight loss is recommended and he has already started doing that via dieting and doing a good job.  I will repeat his liver enzymes to clarify if the bilirubin is direct or indirect, likely benign finding, no biliary ductal dilation.  He incidentally was noted to have a small gallbladder polyp, recommend a follow-up ultrasound in 1 year to reassess that and ensure no interval growth.  Otherwise regarding his renal cyst he inquires about a follow-up MRI, it appears he has not scheduled that yet and I will help schedule that MRI to ensure that it is a benign lesion.  He agreed with plan  Plan: - optical colonoscopy - repeat LFTs, fractionate bilirubin - counseled on fatty liver, he will continue to work on weight loss - repeat RUQ [place] in one year to reassess gallbladder polyp - MRI kidney to further evaluate renal cyst  Roland Cellar, MD [facility]  CC: Ricard Dillon, MD

## 2021-01-22 NOTE — Patient Instructions (Addendum)
If you are age 72 or older, your body mass index should be between 23-30. Your Body mass index is 30.23 kg/m. If this is out of the aforementioned range listed, please consider follow up with your Primary Care Provider.  If you are age 10 or younger, your body mass index should be between 19-25. Your Body mass index is 30.23 kg/m. If this is out of the aformentioned range listed, please consider follow up with your Primary Care Provider.   Please go to the lab in the basement of our building to have lab work done as you leave today. Hit "B" for basement when you get on the elevator.  When the doors open the lab is on your left.  We will call you with the results. Thank you.  You will be contacted by [facility] in the next 2 days to arrange a MRI of your Abdomen (Kidneys - renal cyst).  The number on your caller ID will be [phone], please answer when they call.  If you have not heard from them in 2 days please call [phone] to schedule.    You have been scheduled for a colonoscopy. Please follow written instructions given to you at your visit today.  Please pick up your prep supplies at the pharmacy within the next 1-3 days. If you use inhalers (even only as needed), please bring them with you on the day of your procedure.  You will be due for a recall ultrasound of your gallbladder in 01-2022. We will send you a reminder in the mail when it gets closer to that time.    Thank you for entrusting me with your care and for choosing [facility], Dr. [place] Cellar

## 2021-01-23 ENCOUNTER — Telehealth: Payer: Self-pay

## 2021-01-23 NOTE — Telephone Encounter (Signed)
BMET results faxed to Dr. Philip Aspen at patient request

## 2021-01-23 NOTE — Telephone Encounter (Signed)
-----   Message from Roetta Sessions, CMA sent at 01/22/2021 12:39 PM EDT ----- Regarding: labs to Dr. Richrd Humbles BMET result to Dr. Philip Aspen

## 2021-02-11 ENCOUNTER — Encounter (HOSPITAL_COMMUNITY): Payer: Self-pay

## 2021-02-11 ENCOUNTER — Ambulatory Visit (HOSPITAL_COMMUNITY)
Admission: RE | Admit: 2021-02-11 | Discharge: 2021-02-11 | Disposition: A | Discharge status: Home / Self Care | Payer: BC Managed Care – PPO | Source: Ambulatory Visit | Attending: Gastroenterology | Admitting: Gastroenterology

## 2021-02-11 ENCOUNTER — Other Ambulatory Visit: Payer: Self-pay

## 2021-02-11 DIAGNOSIS — N281 Cyst of kidney, acquired: Secondary | ICD-10-CM

## 2021-02-13 ENCOUNTER — Other Ambulatory Visit: Payer: Self-pay

## 2021-02-13 MED ORDER — DIAZEPAM 5 MG PO TABS: ORAL_TABLET | ORAL | 0 refills | Status: DC

## 2021-02-13 NOTE — Progress Notes (Signed)
Script for Valium sent to [facility]

## 2021-02-25 DIAGNOSIS — Z20822 Contact with and (suspected) exposure to covid-19: Secondary | ICD-10-CM | POA: Diagnosis not present

## 2021-03-15 ENCOUNTER — Ambulatory Visit (HOSPITAL_COMMUNITY)
Admission: RE | Admit: 2021-03-15 | Discharge: 2021-03-15 | Disposition: A | Discharge status: Home / Self Care | Payer: BC Managed Care – PPO | Source: Ambulatory Visit | Attending: Gastroenterology | Admitting: Gastroenterology

## 2021-03-15 ENCOUNTER — Other Ambulatory Visit: Payer: Self-pay

## 2021-03-15 DIAGNOSIS — K760 Fatty (change of) liver, not elsewhere classified: Secondary | ICD-10-CM | POA: Diagnosis not present

## 2021-03-15 DIAGNOSIS — N281 Cyst of kidney, acquired: Secondary | ICD-10-CM | POA: Insufficient documentation

## 2021-03-15 MED ORDER — GADOBUTROL 1 MMOL/ML IV SOLN
8.6000 mL | Freq: Once | INTRAVENOUS | Status: AC | PRN
  Administered 2021-03-15: 8.6 mL via INTRAVENOUS

## 2021-03-21 DIAGNOSIS — L820 Inflamed seborrheic keratosis: Secondary | ICD-10-CM | POA: Diagnosis not present

## 2021-03-28 ENCOUNTER — Encounter: Payer: BC Managed Care – PPO | Admitting: Gastroenterology

## 2021-04-02 ENCOUNTER — Encounter: Payer: Self-pay | Admitting: Gastroenterology

## 2021-04-10 ENCOUNTER — Ambulatory Visit (AMBULATORY_SURGERY_CENTER): Payer: BC Managed Care – PPO | Admitting: Gastroenterology

## 2021-04-10 ENCOUNTER — Encounter: Payer: Self-pay | Admitting: Gastroenterology

## 2021-04-10 ENCOUNTER — Other Ambulatory Visit: Payer: Self-pay

## 2021-04-10 VITALS — BP 127/79 | HR 66 | Temp 97.8°F | Resp 15 | Ht 67.0 in | Wt 193.0 lb

## 2021-04-10 DIAGNOSIS — K5730 Diverticulosis of large intestine without perforation or abscess without bleeding: Secondary | ICD-10-CM

## 2021-04-10 DIAGNOSIS — D120 Benign neoplasm of cecum: Secondary | ICD-10-CM | POA: Diagnosis not present

## 2021-04-10 DIAGNOSIS — R195 Other fecal abnormalities: Secondary | ICD-10-CM

## 2021-04-10 DIAGNOSIS — K648 Other hemorrhoids: Secondary | ICD-10-CM

## 2021-04-10 DIAGNOSIS — D123 Benign neoplasm of transverse colon: Secondary | ICD-10-CM | POA: Diagnosis not present

## 2021-04-10 MED ORDER — SODIUM CHLORIDE 0.9 % IV SOLN: 500.0000 mL | Freq: Once | INTRAVENOUS | Status: DC

## 2021-04-10 NOTE — Progress Notes (Signed)
Called to room to assist during endoscopic procedure.  Patient ID and intended procedure confirmed with present staff. Received instructions for my participation in the procedure from the performing physician.  

## 2021-04-10 NOTE — Patient Instructions (Signed)
Please read handouts provided. Continue present medications. Await pathology results.   YOU HAD AN ENDOSCOPIC PROCEDURE TODAY AT THE [facility]:   Refer to the procedure report that was given to you for any specific questions about what was found during the examination.  If the procedure report does not answer your questions, please call your gastroenterologist to clarify.  If you requested that your care partner not be given the details of your procedure findings, then the procedure report has been included in a sealed envelope for you to review at your convenience later.  YOU SHOULD EXPECT: Some feelings of bloating in the abdomen. Passage of more gas than usual.  Walking can help get rid of the air that was put into your GI tract during the procedure and reduce the bloating. If you had a lower endoscopy (such as a colonoscopy or flexible sigmoidoscopy) you may notice spotting of blood in your stool or on the toilet paper. If you underwent a bowel prep for your procedure, you may not have a normal bowel movement for a few days.  Please Note:  You might notice some irritation and congestion in your nose or some drainage.  This is from the oxygen used during your procedure.  There is no need for concern and it should clear up in a day or so.  SYMPTOMS TO REPORT IMMEDIATELY:  Following lower endoscopy (colonoscopy or flexible sigmoidoscopy):  Excessive amounts of blood in the stool  Significant tenderness or worsening of abdominal pains  Swelling of the abdomen that is new, acute  Fever of 100F or higher   For urgent or emergent issues, a gastroenterologist can be reached at any hour by calling [phone]. Do not use MyChart messaging for urgent concerns.    DIET:  We do recommend a small meal at first, but then you may proceed to your regular diet.  Drink plenty of fluids but you should avoid alcoholic beverages for 24 hours.  ACTIVITY:  You should plan to take it easy  for the rest of today and you should NOT DRIVE or use heavy machinery until tomorrow (because of the sedation medicines used during the test).    FOLLOW UP: Our staff will call the number listed on your records 48-72 hours following your procedure to check on you and address any questions or concerns that you may have regarding the information given to you following your procedure. If we do not reach you, we will leave a message.  We will attempt to reach you two times.  During this call, we will ask if you have developed any symptoms of COVID 19. If you develop any symptoms (ie: fever, flu-like symptoms, shortness of breath, cough etc.) before then, please call [phone].  If you test positive for Covid 19 in the 2 weeks post procedure, please call and report this information to us.    If any biopsies were taken you will be contacted by phone or by letter within the next 1-3 weeks.  Please call us at [phone] if you have not heard about the biopsies in 3 weeks.    SIGNATURES/CONFIDENTIALITY: You and/or your care partner have signed paperwork which will be entered into your electronic medical record.  These signatures attest to the fact that that the information above on your After Visit Summary has been reviewed and is understood.  Full responsibility of the confidentiality of this discharge information lies with you and/or your care-partner.  

## 2021-04-10 NOTE — Progress Notes (Signed)
pt tolerated well. VSS. awake and to recovery. Report given to RN.  

## 2021-04-10 NOTE — Op Note (Signed)
Ian Burns Procedure Date: 04/10/2021 8:40 AM MRN: [id number] Endoscopist: Remo Lipps P. Havery [name] , MD Age: 70 Referring MD:  Date of Birth: 12/09/1950 Gender: Male Account #: [id number] Procedure:                Colonoscopy Indications:              Heme positive stool Medicines:                Monitored Anesthesia Care Procedure:                Pre-Anesthesia Assessment:                           - Prior to the procedure, a History and Physical                            was performed, and patient medications and                            allergies were reviewed. The patient's tolerance of                            previous anesthesia was also reviewed. The risks                            and benefits of the procedure and the sedation                            options and risks were discussed with the patient.                            All questions were answered, and informed consent                            was obtained. Prior Anticoagulants: The patient has                            taken no previous anticoagulant or antiplatelet                            agents. ASA Grade Assessment: II - A patient with                            mild systemic disease. After reviewing the risks                            and benefits, the patient was deemed in                            satisfactory condition to undergo the procedure.                           After obtaining informed consent, the colonoscope  was passed under direct vision. Throughout the                            procedure, the patient's blood pressure, pulse, and                            oxygen saturations were monitored continuously. The                            Olympus PFC-[id number] (#[id number]) Colonoscope was                            introduced through the anus and advanced to the the                            cecum, identified by appendiceal orifice  and                            ileocecal valve. The colonoscopy was performed                            without difficulty. The patient tolerated the                            procedure well. The quality of the bowel                            preparation was good. The ileocecal valve,                            appendiceal orifice, and rectum were photographed. Scope In: 8:43:45 AM Scope Out: 9:06:28 AM Scope Withdrawal Time: 0 hours 15 minutes 46 seconds  Total Procedure Duration: 0 hours 22 minutes 43 seconds  Findings:                 The perianal and digital rectal examinations were                            normal.                           A 7-8 mm polyp was found in the cecum. The polyp                            was sessile. The polyp was removed with a cold                            snare. Resection and retrieval were complete.                           A 4 mm polyp was found in the transverse colon. The                            polyp was sessile. The polyp was removed with  a                            cold snare. Resection and retrieval were complete.                           Multiple medium-mouthed diverticula were found in                            the hepatic flexure, ascending colon and left colon.                           Internal hemorrhoids were found during retroflexion.                           The exam was otherwise without abnormality. Complications:            No immediate complications. Estimated blood loss:                            Minimal. Estimated Blood Loss:     Estimated blood loss was minimal. Impression:               - One 7-8 mm polyp in the cecum, removed with a                            cold snare. Resected and retrieved.                           - One 4 mm polyp in the transverse colon, removed                            with a cold snare. Resected and retrieved.                           - Diverticulosis at the hepatic flexure, in the                             ascending colon and in the left colon.                           - Internal hemorrhoids.                           - The examination was otherwise normal. Recommendation:           - Patient has a contact number available for                            emergencies. The signs and symptoms of potential                            delayed complications were discussed with the                            patient. Return to normal activities  tomorrow.                            Written discharge instructions were provided to the                            patient.                           - Resume previous diet.                           - Continue present medications.                           - Await pathology results. Remo Lipps P. Skyylar Kopf, MD 04/10/2021 9:10:18 AM This report has been signed electronically.

## 2021-04-10 NOTE — Progress Notes (Signed)
CHECK-IN-JB  VITAL SIGNS-[place]

## 2021-04-12 ENCOUNTER — Telehealth: Payer: Self-pay | Admitting: *Deleted

## 2021-04-12 ENCOUNTER — Ambulatory Visit: Payer: BC Managed Care – PPO | Admitting: Cardiology

## 2021-04-12 DIAGNOSIS — I10 Essential (primary) hypertension: Secondary | ICD-10-CM | POA: Diagnosis not present

## 2021-04-12 NOTE — Telephone Encounter (Signed)
  Follow up Call-  Call back number 04/10/2021  Post procedure Call Back phone  # [phone]  Permission to leave phone message Yes  Some recent data might be hidden     Patient questions:  Do you have a fever, pain , or abdominal swelling? No. Pain Score  0 *  Have you tolerated food without any problems? Yes.    Have you been able to return to your normal activities? Yes.    Do you have any questions about your discharge instructions: Diet   No. Medications  No. Follow up visit  No.  Do you have questions or concerns about your Care? No.  Actions: * If pain score is 4 or above: No action needed, pain <4.Have you developed a fever since your procedure? no  2.   Have you had an respiratory symptoms (SOB or cough) since your procedure? no  3.   Have you tested positive for COVID 19 since your procedure no  4.   Have you had any family members/close contacts diagnosed with the COVID 19 since your procedure?  no   If yes to any of these questions please route to Joylene John, RN and Joella Prince, RN

## 2021-06-25 DIAGNOSIS — Z20822 Contact with and (suspected) exposure to covid-19: Secondary | ICD-10-CM | POA: Diagnosis not present

## 2021-07-22 DIAGNOSIS — H2513 Age-related nuclear cataract, bilateral: Secondary | ICD-10-CM | POA: Diagnosis not present

## 2021-07-22 DIAGNOSIS — H5203 Hypermetropia, bilateral: Secondary | ICD-10-CM | POA: Diagnosis not present

## 2021-07-22 DIAGNOSIS — H25013 Cortical age-related cataract, bilateral: Secondary | ICD-10-CM | POA: Diagnosis not present

## 2021-08-12 DIAGNOSIS — R3914 Feeling of incomplete bladder emptying: Secondary | ICD-10-CM | POA: Diagnosis not present

## 2021-08-12 DIAGNOSIS — N401 Enlarged prostate with lower urinary tract symptoms: Secondary | ICD-10-CM | POA: Diagnosis not present

## 2021-08-13 DIAGNOSIS — H2513 Age-related nuclear cataract, bilateral: Secondary | ICD-10-CM | POA: Diagnosis not present

## 2021-09-04 DIAGNOSIS — R351 Nocturia: Secondary | ICD-10-CM | POA: Diagnosis not present

## 2021-09-04 DIAGNOSIS — R3914 Feeling of incomplete bladder emptying: Secondary | ICD-10-CM | POA: Diagnosis not present

## 2021-09-04 DIAGNOSIS — N401 Enlarged prostate with lower urinary tract symptoms: Secondary | ICD-10-CM | POA: Diagnosis not present

## 2021-09-04 DIAGNOSIS — R972 Elevated prostate specific antigen [PSA]: Secondary | ICD-10-CM | POA: Diagnosis not present

## 2021-10-11 DIAGNOSIS — C44519 Basal cell carcinoma of skin of other part of trunk: Secondary | ICD-10-CM | POA: Diagnosis not present

## 2021-10-11 DIAGNOSIS — D485 Neoplasm of uncertain behavior of skin: Secondary | ICD-10-CM | POA: Diagnosis not present

## 2021-10-11 DIAGNOSIS — L821 Other seborrheic keratosis: Secondary | ICD-10-CM | POA: Diagnosis not present

## 2021-10-11 DIAGNOSIS — D225 Melanocytic nevi of trunk: Secondary | ICD-10-CM | POA: Diagnosis not present

## 2021-10-11 DIAGNOSIS — L814 Other melanin hyperpigmentation: Secondary | ICD-10-CM | POA: Diagnosis not present

## 2021-10-11 DIAGNOSIS — L570 Actinic keratosis: Secondary | ICD-10-CM | POA: Diagnosis not present

## 2021-10-25 DIAGNOSIS — C44519 Basal cell carcinoma of skin of other part of trunk: Secondary | ICD-10-CM | POA: Diagnosis not present

## 2021-10-25 DIAGNOSIS — L570 Actinic keratosis: Secondary | ICD-10-CM | POA: Diagnosis not present

## 2021-12-27 DIAGNOSIS — R7301 Impaired fasting glucose: Secondary | ICD-10-CM | POA: Diagnosis not present

## 2021-12-27 DIAGNOSIS — E785 Hyperlipidemia, unspecified: Secondary | ICD-10-CM | POA: Diagnosis not present

## 2021-12-27 DIAGNOSIS — Z125 Encounter for screening for malignant neoplasm of prostate: Secondary | ICD-10-CM | POA: Diagnosis not present

## 2022-01-03 DIAGNOSIS — I10 Essential (primary) hypertension: Secondary | ICD-10-CM | POA: Diagnosis not present

## 2022-01-03 DIAGNOSIS — Z0000 Encounter for general adult medical examination without abnormal findings: Secondary | ICD-10-CM | POA: Diagnosis not present

## 2022-01-03 DIAGNOSIS — Z1331 Encounter for screening for depression: Secondary | ICD-10-CM | POA: Diagnosis not present

## 2022-01-03 DIAGNOSIS — Z1339 Encounter for screening examination for other mental health and behavioral disorders: Secondary | ICD-10-CM | POA: Diagnosis not present

## 2022-03-27 DIAGNOSIS — R7301 Impaired fasting glucose: Secondary | ICD-10-CM | POA: Diagnosis not present

## 2022-07-03 DIAGNOSIS — R7301 Impaired fasting glucose: Secondary | ICD-10-CM | POA: Diagnosis not present

## 2022-07-14 DIAGNOSIS — H2513 Age-related nuclear cataract, bilateral: Secondary | ICD-10-CM | POA: Diagnosis not present

## 2022-07-24 DIAGNOSIS — R351 Nocturia: Secondary | ICD-10-CM | POA: Diagnosis not present

## 2022-07-24 DIAGNOSIS — N401 Enlarged prostate with lower urinary tract symptoms: Secondary | ICD-10-CM | POA: Diagnosis not present

## 2022-07-27 IMAGING — CT CT HEART MORP W/ CTA COR W/ SCORE W/ CA W/CM &/OR W/O CM
4 of 7 series · 8 of 20 positions shown, 9 images · IV contrast (APPLIED)
Comparison: No priors.
COMPARISON: No priors.

Addendum:
EXAM:
OVER-READ INTERPRETATION  CT CHEST

The following report is an over-read performed by radiologist Dr.
Sho Papas [REDACTED] on 01/11/2021. This
over-read does not include interpretation of cardiac or coronary
anatomy or pathology. The coronary calcium score/coronary CTA
interpretation by the cardiologist is attached.
CLINICAL DATA: 69 Year old White Male
Cardiac/Coronary  CTA
TECHNIQUE: The patient was scanned on a [employer] Force scanner.

[Series 7: best diast 71 % · axial · 0.46mm/px · z∈[+1176,+1221]mm · 2 of 338 slices shown]
[im 113/338  vessel]
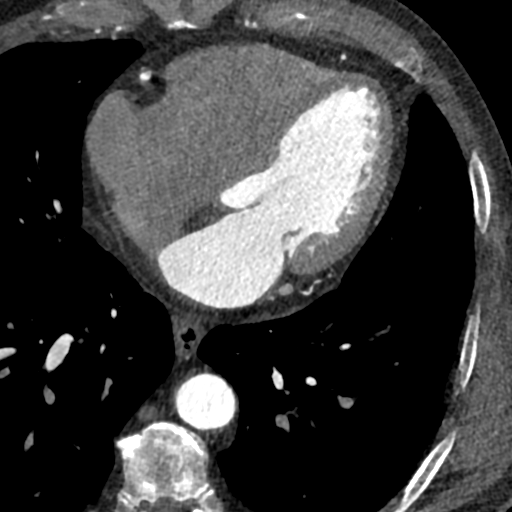
[im 225/338  vessel]
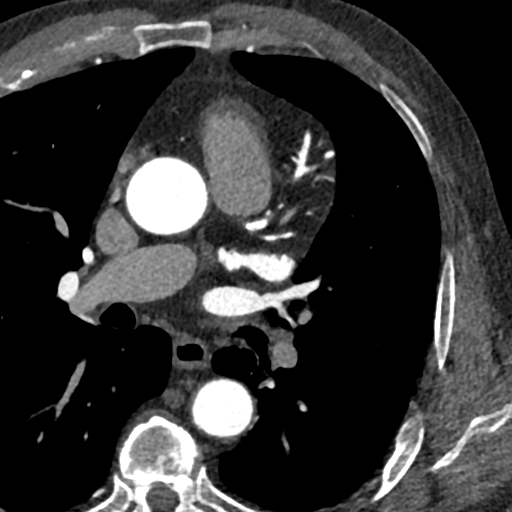

[Series 8: best syst · axial · 0.46mm/px · z∈[+1176,+1221]mm · 2 of 338 slices shown, 3 images]
[im 113/338  vessel]
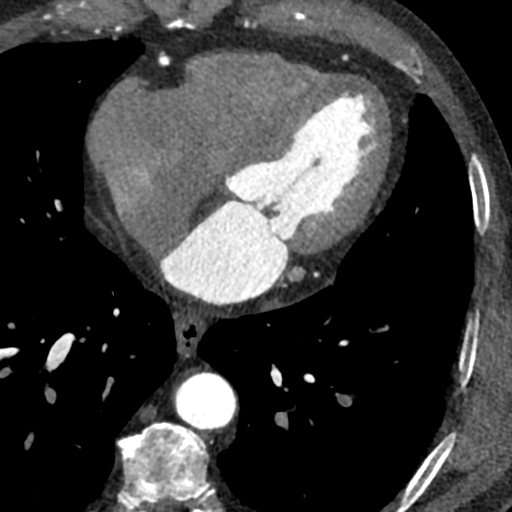
[im 113/338  lung]
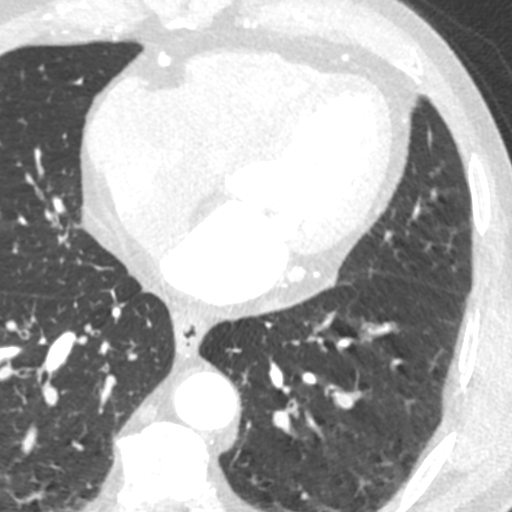
[im 225/338  vessel]
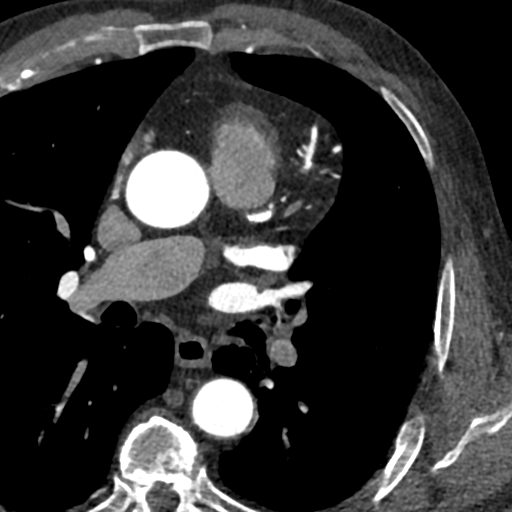

[Series 9: ts diast sharp 71 % · axial · 0.46mm/px · z∈[+1176,+1221]mm · 2 of 338 slices shown]
[im 113/338  lung]
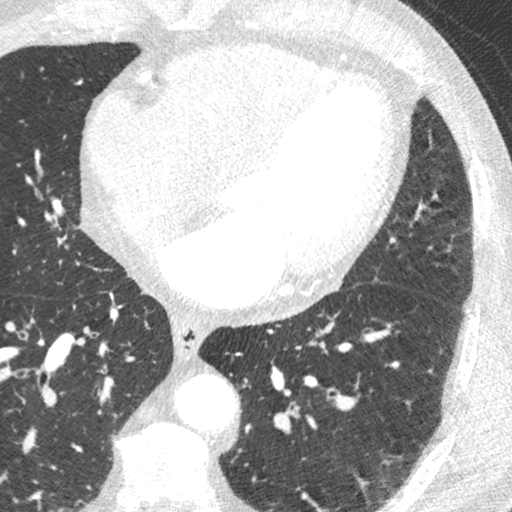
[im 225/338  lung]
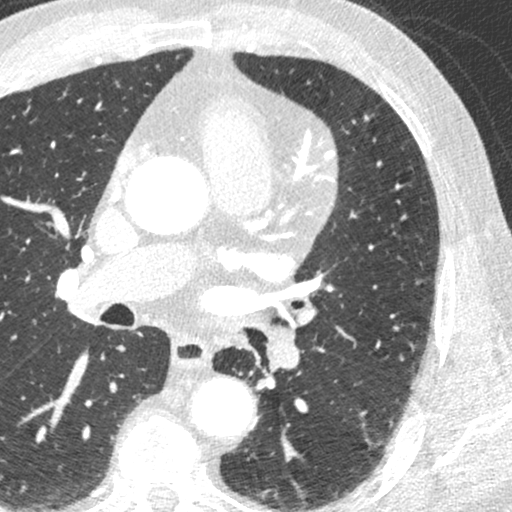

[Series 10: ts syst sharp · axial · 0.46mm/px · z∈[+1176,+1221]mm · 2 of 338 slices shown]
[im 113/338  lung]
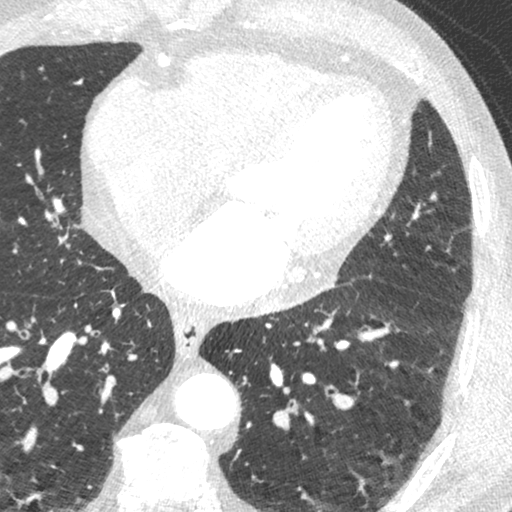
[im 225/338  lung]
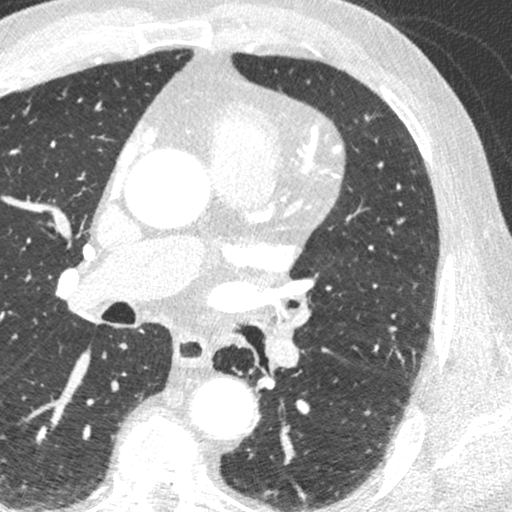

[8 of 20 positions shown; findings below may reference images not displayed]

FINDINGS: Aortic atherosclerosis. Within the visualized portions of the thorax
there are no suspicious appearing pulmonary nodules or masses, there
is no acute consolidative airspace disease, no pleural effusions, no
pneumothorax and no lymphadenopathy. Visualized portions of the
upper abdomen are unremarkable. There are no aggressive appearing
lytic or blastic lesions noted in the visualized portions of the
skeleton. Several old healed left-sided rib fractures.
IMPRESSION: 1.  Aortic Atherosclerosis (7L0ZH-JLH.H).
FINDINGS: A 100 kV prospective scan was triggered in the descending thoracic
aorta at 111 HU's. Axial non-contrast 3 mm slices were carried out
through the heart. The data set was analyzed on a dedicated work
station and scored using the Agatson method. Gantry rotation speed
was 250 msecs and collimation was .6 mm. No beta blockade and 0.8 mg
of sl NTG was given. The 3D data set was reconstructed in 5%
intervals of the 67-82 % of the R-R cycle. Diastolic phases were
analyzed on a dedicated work station using MPR, MIP and VRT modes.
The patient received 80 cc of contrast.

Aorta: Normal size. Aortic arch atherosclerosis noted. No
dissection.

Aortic Valve:  Tri-leaflet.  No calcifications.

Coronary Arteries:  Normal coronary origin.  Right dominance.

Coronary Calcium Score:

Left main: 0

Left anterior descending artery: 35

Left circumflex artery: 0

Right coronary artery: 2

Total: 37

Percentile: 28th for age, sex, and race matched control.

RCA is a large dominant artery that gives rise to PDA and PLA. There
is a minimal non-obstructive (1-24%) calcified plaque in the mid
vessel.

Left main is a large artery that gives rise to LAD and LCX arteries.
There is no plaque.

LAD is a large vessel that gives rise to a small D1 vessel and a
larger D2 branch that gives off smaller vessels. Small D3 vessel.
There is a minimal non-obstructive (1-24%) calcified plaque in the
mid vessel. There is a minimal non-obstructive (1-24%) calcified
plaque in the distal vessel.

LCX is a non-dominant artery that gives bifurcated into an OM1
vessel. There is no plaque.

There is a ramus intermedius vessel with no plaque.

Other findings:

Atypical pulmonary vein drainage into the left atrium: common right
pulmonary vein.

Normal left atrial appendage without a thrombus.

Normal size of the pulmonary artery.

Small PFO noted.

Extra-cardiac findings: See attached radiology report for
non-cardiac structures.
IMPRESSION: 1. Coronary calcium score of 37. This was 28th percentile for age,
sex, and race matched control.

2. Normal coronary origin with right dominance.

3. CAD-RADS 1. Minimal non-obstructive CAD (1-24%). Consider
non-atherosclerotic causes of chest pain. Consider preventive
therapy and risk factor modification.

4. Atypical pulmonary vein drainage into the left atrium: common
right pulmonary vein.

5. Small PFO noted.

6. Aortic arch atherosclerosis noted.

RECOMMENDATIONS:

Coronary artery calcium (CAC) score is a strong predictor of
incident coronary heart disease (CHD) and provides predictive
information beyond traditional risk factors. CAC scoring is
reasonable to use in the decision to withhold, postpone, or initiate
statin therapy in intermediate-risk or selected borderline-risk
asymptomatic adults (age 40-75 years and LDL-C ?70 to <190 mg/dL)
who do not have diabetes or established atherosclerotic
cardiovascular disease (ASCVD).* In intermediate-risk (10-year ASCVD
risk ?7.5% to <20%) adults or selected borderline-risk (10-year
ASCVD risk ?5% to <7.5%) adults in whom a CAC score is measured for
the purpose of making a treatment decision the following
recommendations have been made:

If CAC = 0, it is reasonable to withhold statin therapy and reassess
in 5 to 10 years, as long as higher risk conditions are absent
(diabetes mellitus, family history of premature CHD in first degree
relatives (males <55 years; females <65 years), cigarette smoking,
LDL ?190 mg/dL or other independent risk factors).

If CAC is 1 to 99, it is reasonable to initiate statin therapy for
patients ?55 years of age.

If CAC is ?100 or ?75th percentile, it is reasonable to initiate
statin therapy at any age.

Cardiology referral should be considered for patients with CAC
scores ?400 or ?75th percentile.

*9852 AHA/ACC/AACVPR/AAPA/ABC/NYA/RINNAH/NADAN/[place]vanlar/DON LOLITO/LUXURYCLOTHING/TANNOUS
Guideline on the Management of Blood Cholesterol: A Report of the
American College of Cardiology/American Heart Association Task Force
on Clinical Practice Guidelines. J Am Coll Cardiol.
8915;73(24):3645-3895.

*** End of Addendum ***
EXAM:
OVER-READ INTERPRETATION  CT CHEST

The following report is an over-read performed by radiologist Dr.
Sho Papas [REDACTED] on 01/11/2021. This
over-read does not include interpretation of cardiac or coronary
anatomy or pathology. The coronary calcium score/coronary CTA
interpretation by the cardiologist is attached.
FINDINGS: Aortic atherosclerosis. Within the visualized portions of the thorax
there are no suspicious appearing pulmonary nodules or masses, there
is no acute consolidative airspace disease, no pleural effusions, no
pneumothorax and no lymphadenopathy. Visualized portions of the
upper abdomen are unremarkable. There are no aggressive appearing
lytic or blastic lesions noted in the visualized portions of the
skeleton. Several old healed left-sided rib fractures.
IMPRESSION: 1.  Aortic Atherosclerosis (7L0ZH-JLH.H).

## 2022-07-28 DIAGNOSIS — R972 Elevated prostate specific antigen [PSA]: Secondary | ICD-10-CM | POA: Diagnosis not present

## 2022-07-28 DIAGNOSIS — N401 Enlarged prostate with lower urinary tract symptoms: Secondary | ICD-10-CM | POA: Diagnosis not present

## 2022-07-28 DIAGNOSIS — R351 Nocturia: Secondary | ICD-10-CM | POA: Diagnosis not present

## 2022-08-20 DIAGNOSIS — H2512 Age-related nuclear cataract, left eye: Secondary | ICD-10-CM | POA: Diagnosis not present

## 2022-08-20 DIAGNOSIS — H21562 Pupillary abnormality, left eye: Secondary | ICD-10-CM | POA: Diagnosis not present

## 2022-08-20 DIAGNOSIS — H269 Unspecified cataract: Secondary | ICD-10-CM | POA: Diagnosis not present

## 2022-09-03 DIAGNOSIS — H269 Unspecified cataract: Secondary | ICD-10-CM | POA: Diagnosis not present

## 2022-09-03 DIAGNOSIS — H2511 Age-related nuclear cataract, right eye: Secondary | ICD-10-CM | POA: Diagnosis not present

## 2022-09-25 DIAGNOSIS — R7301 Impaired fasting glucose: Secondary | ICD-10-CM | POA: Diagnosis not present

## 2022-10-17 DIAGNOSIS — M79671 Pain in right foot: Secondary | ICD-10-CM | POA: Diagnosis not present

## 2022-10-17 DIAGNOSIS — M21611 Bunion of right foot: Secondary | ICD-10-CM | POA: Diagnosis not present

## 2022-10-27 DIAGNOSIS — N401 Enlarged prostate with lower urinary tract symptoms: Secondary | ICD-10-CM | POA: Diagnosis not present

## 2022-10-27 DIAGNOSIS — R351 Nocturia: Secondary | ICD-10-CM | POA: Diagnosis not present

## 2022-10-28 DIAGNOSIS — L814 Other melanin hyperpigmentation: Secondary | ICD-10-CM | POA: Diagnosis not present

## 2022-10-28 DIAGNOSIS — L820 Inflamed seborrheic keratosis: Secondary | ICD-10-CM | POA: Diagnosis not present

## 2022-10-28 DIAGNOSIS — L570 Actinic keratosis: Secondary | ICD-10-CM | POA: Diagnosis not present

## 2022-10-28 DIAGNOSIS — L309 Dermatitis, unspecified: Secondary | ICD-10-CM | POA: Diagnosis not present

## 2022-10-28 DIAGNOSIS — Z85828 Personal history of other malignant neoplasm of skin: Secondary | ICD-10-CM | POA: Diagnosis not present

## 2022-10-28 DIAGNOSIS — D485 Neoplasm of uncertain behavior of skin: Secondary | ICD-10-CM | POA: Diagnosis not present

## 2022-10-28 DIAGNOSIS — L821 Other seborrheic keratosis: Secondary | ICD-10-CM | POA: Diagnosis not present

## 2022-10-28 DIAGNOSIS — L739 Follicular disorder, unspecified: Secondary | ICD-10-CM | POA: Diagnosis not present

## 2022-11-18 DIAGNOSIS — M79671 Pain in right foot: Secondary | ICD-10-CM | POA: Diagnosis not present

## 2022-11-25 DIAGNOSIS — M79671 Pain in right foot: Secondary | ICD-10-CM | POA: Diagnosis not present

## 2022-11-26 DIAGNOSIS — M9901 Segmental and somatic dysfunction of cervical region: Secondary | ICD-10-CM | POA: Diagnosis not present

## 2022-11-26 DIAGNOSIS — M50122 Cervical disc disorder at C5-C6 level with radiculopathy: Secondary | ICD-10-CM | POA: Diagnosis not present

## 2022-11-26 DIAGNOSIS — M9902 Segmental and somatic dysfunction of thoracic region: Secondary | ICD-10-CM | POA: Diagnosis not present

## 2022-11-26 DIAGNOSIS — M50322 Other cervical disc degeneration at C5-C6 level: Secondary | ICD-10-CM | POA: Diagnosis not present

## 2022-11-28 DIAGNOSIS — M9902 Segmental and somatic dysfunction of thoracic region: Secondary | ICD-10-CM | POA: Diagnosis not present

## 2022-11-28 DIAGNOSIS — M50122 Cervical disc disorder at C5-C6 level with radiculopathy: Secondary | ICD-10-CM | POA: Diagnosis not present

## 2022-11-28 DIAGNOSIS — M9901 Segmental and somatic dysfunction of cervical region: Secondary | ICD-10-CM | POA: Diagnosis not present

## 2022-11-28 DIAGNOSIS — M50322 Other cervical disc degeneration at C5-C6 level: Secondary | ICD-10-CM | POA: Diagnosis not present

## 2022-12-05 DIAGNOSIS — M50122 Cervical disc disorder at C5-C6 level with radiculopathy: Secondary | ICD-10-CM | POA: Diagnosis not present

## 2022-12-05 DIAGNOSIS — M9901 Segmental and somatic dysfunction of cervical region: Secondary | ICD-10-CM | POA: Diagnosis not present

## 2022-12-05 DIAGNOSIS — M9902 Segmental and somatic dysfunction of thoracic region: Secondary | ICD-10-CM | POA: Diagnosis not present

## 2022-12-05 DIAGNOSIS — M50322 Other cervical disc degeneration at C5-C6 level: Secondary | ICD-10-CM | POA: Diagnosis not present

## 2022-12-08 DIAGNOSIS — M9901 Segmental and somatic dysfunction of cervical region: Secondary | ICD-10-CM | POA: Diagnosis not present

## 2022-12-08 DIAGNOSIS — M50322 Other cervical disc degeneration at C5-C6 level: Secondary | ICD-10-CM | POA: Diagnosis not present

## 2022-12-08 DIAGNOSIS — M9902 Segmental and somatic dysfunction of thoracic region: Secondary | ICD-10-CM | POA: Diagnosis not present

## 2022-12-08 DIAGNOSIS — M50122 Cervical disc disorder at C5-C6 level with radiculopathy: Secondary | ICD-10-CM | POA: Diagnosis not present

## 2022-12-11 DIAGNOSIS — M9901 Segmental and somatic dysfunction of cervical region: Secondary | ICD-10-CM | POA: Diagnosis not present

## 2022-12-11 DIAGNOSIS — M50322 Other cervical disc degeneration at C5-C6 level: Secondary | ICD-10-CM | POA: Diagnosis not present

## 2022-12-11 DIAGNOSIS — M9902 Segmental and somatic dysfunction of thoracic region: Secondary | ICD-10-CM | POA: Diagnosis not present

## 2022-12-11 DIAGNOSIS — M50122 Cervical disc disorder at C5-C6 level with radiculopathy: Secondary | ICD-10-CM | POA: Diagnosis not present

## 2022-12-16 DIAGNOSIS — M50322 Other cervical disc degeneration at C5-C6 level: Secondary | ICD-10-CM | POA: Diagnosis not present

## 2022-12-16 DIAGNOSIS — M50122 Cervical disc disorder at C5-C6 level with radiculopathy: Secondary | ICD-10-CM | POA: Diagnosis not present

## 2022-12-16 DIAGNOSIS — M9902 Segmental and somatic dysfunction of thoracic region: Secondary | ICD-10-CM | POA: Diagnosis not present

## 2022-12-16 DIAGNOSIS — M9901 Segmental and somatic dysfunction of cervical region: Secondary | ICD-10-CM | POA: Diagnosis not present

## 2022-12-19 DIAGNOSIS — M50122 Cervical disc disorder at C5-C6 level with radiculopathy: Secondary | ICD-10-CM | POA: Diagnosis not present

## 2022-12-19 DIAGNOSIS — M9902 Segmental and somatic dysfunction of thoracic region: Secondary | ICD-10-CM | POA: Diagnosis not present

## 2022-12-19 DIAGNOSIS — M9901 Segmental and somatic dysfunction of cervical region: Secondary | ICD-10-CM | POA: Diagnosis not present

## 2022-12-19 DIAGNOSIS — M50322 Other cervical disc degeneration at C5-C6 level: Secondary | ICD-10-CM | POA: Diagnosis not present

## 2022-12-23 DIAGNOSIS — M9902 Segmental and somatic dysfunction of thoracic region: Secondary | ICD-10-CM | POA: Diagnosis not present

## 2022-12-23 DIAGNOSIS — M50122 Cervical disc disorder at C5-C6 level with radiculopathy: Secondary | ICD-10-CM | POA: Diagnosis not present

## 2022-12-23 DIAGNOSIS — M50322 Other cervical disc degeneration at C5-C6 level: Secondary | ICD-10-CM | POA: Diagnosis not present

## 2022-12-23 DIAGNOSIS — M9901 Segmental and somatic dysfunction of cervical region: Secondary | ICD-10-CM | POA: Diagnosis not present

## 2022-12-29 DIAGNOSIS — M50122 Cervical disc disorder at C5-C6 level with radiculopathy: Secondary | ICD-10-CM | POA: Diagnosis not present

## 2022-12-29 DIAGNOSIS — M9901 Segmental and somatic dysfunction of cervical region: Secondary | ICD-10-CM | POA: Diagnosis not present

## 2022-12-29 DIAGNOSIS — M9907 Segmental and somatic dysfunction of upper extremity: Secondary | ICD-10-CM | POA: Diagnosis not present

## 2022-12-29 DIAGNOSIS — M50322 Other cervical disc degeneration at C5-C6 level: Secondary | ICD-10-CM | POA: Diagnosis not present

## 2022-12-29 DIAGNOSIS — M9902 Segmental and somatic dysfunction of thoracic region: Secondary | ICD-10-CM | POA: Diagnosis not present

## 2023-01-02 DIAGNOSIS — M9907 Segmental and somatic dysfunction of upper extremity: Secondary | ICD-10-CM | POA: Diagnosis not present

## 2023-01-02 DIAGNOSIS — M50122 Cervical disc disorder at C5-C6 level with radiculopathy: Secondary | ICD-10-CM | POA: Diagnosis not present

## 2023-01-02 DIAGNOSIS — M50322 Other cervical disc degeneration at C5-C6 level: Secondary | ICD-10-CM | POA: Diagnosis not present

## 2023-01-02 DIAGNOSIS — M9902 Segmental and somatic dysfunction of thoracic region: Secondary | ICD-10-CM | POA: Diagnosis not present

## 2023-01-02 DIAGNOSIS — M9901 Segmental and somatic dysfunction of cervical region: Secondary | ICD-10-CM | POA: Diagnosis not present

## 2023-01-08 DIAGNOSIS — M50322 Other cervical disc degeneration at C5-C6 level: Secondary | ICD-10-CM | POA: Diagnosis not present

## 2023-01-08 DIAGNOSIS — M50122 Cervical disc disorder at C5-C6 level with radiculopathy: Secondary | ICD-10-CM | POA: Diagnosis not present

## 2023-01-08 DIAGNOSIS — M9907 Segmental and somatic dysfunction of upper extremity: Secondary | ICD-10-CM | POA: Diagnosis not present

## 2023-01-08 DIAGNOSIS — M9902 Segmental and somatic dysfunction of thoracic region: Secondary | ICD-10-CM | POA: Diagnosis not present

## 2023-01-08 DIAGNOSIS — M9901 Segmental and somatic dysfunction of cervical region: Secondary | ICD-10-CM | POA: Diagnosis not present

## 2023-01-12 DIAGNOSIS — M9901 Segmental and somatic dysfunction of cervical region: Secondary | ICD-10-CM | POA: Diagnosis not present

## 2023-01-12 DIAGNOSIS — M9902 Segmental and somatic dysfunction of thoracic region: Secondary | ICD-10-CM | POA: Diagnosis not present

## 2023-01-12 DIAGNOSIS — M50322 Other cervical disc degeneration at C5-C6 level: Secondary | ICD-10-CM | POA: Diagnosis not present

## 2023-01-12 DIAGNOSIS — M50122 Cervical disc disorder at C5-C6 level with radiculopathy: Secondary | ICD-10-CM | POA: Diagnosis not present

## 2023-01-12 DIAGNOSIS — M9907 Segmental and somatic dysfunction of upper extremity: Secondary | ICD-10-CM | POA: Diagnosis not present

## 2023-01-13 DIAGNOSIS — E785 Hyperlipidemia, unspecified: Secondary | ICD-10-CM | POA: Diagnosis not present

## 2023-01-13 DIAGNOSIS — Z125 Encounter for screening for malignant neoplasm of prostate: Secondary | ICD-10-CM | POA: Diagnosis not present

## 2023-01-13 DIAGNOSIS — R7989 Other specified abnormal findings of blood chemistry: Secondary | ICD-10-CM | POA: Diagnosis not present

## 2023-01-13 DIAGNOSIS — R7301 Impaired fasting glucose: Secondary | ICD-10-CM | POA: Diagnosis not present

## 2023-01-13 DIAGNOSIS — I10 Essential (primary) hypertension: Secondary | ICD-10-CM | POA: Diagnosis not present

## 2023-01-15 DIAGNOSIS — M50322 Other cervical disc degeneration at C5-C6 level: Secondary | ICD-10-CM | POA: Diagnosis not present

## 2023-01-15 DIAGNOSIS — M9907 Segmental and somatic dysfunction of upper extremity: Secondary | ICD-10-CM | POA: Diagnosis not present

## 2023-01-15 DIAGNOSIS — M9901 Segmental and somatic dysfunction of cervical region: Secondary | ICD-10-CM | POA: Diagnosis not present

## 2023-01-15 DIAGNOSIS — M50122 Cervical disc disorder at C5-C6 level with radiculopathy: Secondary | ICD-10-CM | POA: Diagnosis not present

## 2023-01-15 DIAGNOSIS — M9902 Segmental and somatic dysfunction of thoracic region: Secondary | ICD-10-CM | POA: Diagnosis not present

## 2023-01-20 DIAGNOSIS — R7301 Impaired fasting glucose: Secondary | ICD-10-CM | POA: Diagnosis not present

## 2023-01-20 DIAGNOSIS — Z0000 Encounter for general adult medical examination without abnormal findings: Secondary | ICD-10-CM | POA: Diagnosis not present

## 2023-01-20 DIAGNOSIS — E785 Hyperlipidemia, unspecified: Secondary | ICD-10-CM | POA: Diagnosis not present

## 2023-01-20 DIAGNOSIS — J45909 Unspecified asthma, uncomplicated: Secondary | ICD-10-CM | POA: Diagnosis not present

## 2023-01-20 DIAGNOSIS — I10 Essential (primary) hypertension: Secondary | ICD-10-CM | POA: Diagnosis not present

## 2023-01-22 DIAGNOSIS — Z1212 Encounter for screening for malignant neoplasm of rectum: Secondary | ICD-10-CM | POA: Diagnosis not present

## 2023-01-22 DIAGNOSIS — M9907 Segmental and somatic dysfunction of upper extremity: Secondary | ICD-10-CM | POA: Diagnosis not present

## 2023-01-22 DIAGNOSIS — M50322 Other cervical disc degeneration at C5-C6 level: Secondary | ICD-10-CM | POA: Diagnosis not present

## 2023-01-22 DIAGNOSIS — I10 Essential (primary) hypertension: Secondary | ICD-10-CM | POA: Diagnosis not present

## 2023-01-22 DIAGNOSIS — R82998 Other abnormal findings in urine: Secondary | ICD-10-CM | POA: Diagnosis not present

## 2023-01-22 DIAGNOSIS — M9902 Segmental and somatic dysfunction of thoracic region: Secondary | ICD-10-CM | POA: Diagnosis not present

## 2023-01-22 DIAGNOSIS — M50122 Cervical disc disorder at C5-C6 level with radiculopathy: Secondary | ICD-10-CM | POA: Diagnosis not present

## 2023-01-22 DIAGNOSIS — M9901 Segmental and somatic dysfunction of cervical region: Secondary | ICD-10-CM | POA: Diagnosis not present

## 2023-01-29 DIAGNOSIS — M50322 Other cervical disc degeneration at C5-C6 level: Secondary | ICD-10-CM | POA: Diagnosis not present

## 2023-01-29 DIAGNOSIS — M50122 Cervical disc disorder at C5-C6 level with radiculopathy: Secondary | ICD-10-CM | POA: Diagnosis not present

## 2023-01-29 DIAGNOSIS — M9902 Segmental and somatic dysfunction of thoracic region: Secondary | ICD-10-CM | POA: Diagnosis not present

## 2023-01-29 DIAGNOSIS — M9907 Segmental and somatic dysfunction of upper extremity: Secondary | ICD-10-CM | POA: Diagnosis not present

## 2023-01-29 DIAGNOSIS — M9901 Segmental and somatic dysfunction of cervical region: Secondary | ICD-10-CM | POA: Diagnosis not present

## 2023-02-06 DIAGNOSIS — M9907 Segmental and somatic dysfunction of upper extremity: Secondary | ICD-10-CM | POA: Diagnosis not present

## 2023-02-06 DIAGNOSIS — M9902 Segmental and somatic dysfunction of thoracic region: Secondary | ICD-10-CM | POA: Diagnosis not present

## 2023-02-06 DIAGNOSIS — M50322 Other cervical disc degeneration at C5-C6 level: Secondary | ICD-10-CM | POA: Diagnosis not present

## 2023-02-06 DIAGNOSIS — M50122 Cervical disc disorder at C5-C6 level with radiculopathy: Secondary | ICD-10-CM | POA: Diagnosis not present

## 2023-02-06 DIAGNOSIS — M9901 Segmental and somatic dysfunction of cervical region: Secondary | ICD-10-CM | POA: Diagnosis not present

## 2023-02-09 ENCOUNTER — Other Ambulatory Visit (HOSPITAL_BASED_OUTPATIENT_CLINIC_OR_DEPARTMENT_OTHER): Payer: Self-pay

## 2023-02-09 ENCOUNTER — Encounter (HOSPITAL_BASED_OUTPATIENT_CLINIC_OR_DEPARTMENT_OTHER): Payer: Self-pay

## 2023-02-09 DIAGNOSIS — M50322 Other cervical disc degeneration at C5-C6 level: Secondary | ICD-10-CM | POA: Diagnosis not present

## 2023-02-09 DIAGNOSIS — M47812 Spondylosis without myelopathy or radiculopathy, cervical region: Secondary | ICD-10-CM | POA: Diagnosis not present

## 2023-02-09 DIAGNOSIS — M2578 Osteophyte, vertebrae: Secondary | ICD-10-CM | POA: Diagnosis not present

## 2023-02-09 DIAGNOSIS — M4802 Spinal stenosis, cervical region: Secondary | ICD-10-CM | POA: Diagnosis not present

## 2023-02-09 MED ORDER — MOUNJARO 10 MG/0.5ML ~~LOC~~ SOAJ
10.0000 mg | SUBCUTANEOUS | 3 refills | Status: AC
  Filled 2023-02-09: qty 2, 28d supply, fill #0
  Filled 2023-03-09: qty 2, 28d supply, fill #1

## 2023-02-11 DIAGNOSIS — M50122 Cervical disc disorder at C5-C6 level with radiculopathy: Secondary | ICD-10-CM | POA: Diagnosis not present

## 2023-02-11 DIAGNOSIS — M9907 Segmental and somatic dysfunction of upper extremity: Secondary | ICD-10-CM | POA: Diagnosis not present

## 2023-02-11 DIAGNOSIS — M50322 Other cervical disc degeneration at C5-C6 level: Secondary | ICD-10-CM | POA: Diagnosis not present

## 2023-02-11 DIAGNOSIS — M9901 Segmental and somatic dysfunction of cervical region: Secondary | ICD-10-CM | POA: Diagnosis not present

## 2023-02-11 DIAGNOSIS — M9902 Segmental and somatic dysfunction of thoracic region: Secondary | ICD-10-CM | POA: Diagnosis not present

## 2023-02-16 DIAGNOSIS — M9901 Segmental and somatic dysfunction of cervical region: Secondary | ICD-10-CM | POA: Diagnosis not present

## 2023-02-16 DIAGNOSIS — M9902 Segmental and somatic dysfunction of thoracic region: Secondary | ICD-10-CM | POA: Diagnosis not present

## 2023-02-16 DIAGNOSIS — M50322 Other cervical disc degeneration at C5-C6 level: Secondary | ICD-10-CM | POA: Diagnosis not present

## 2023-02-16 DIAGNOSIS — M50122 Cervical disc disorder at C5-C6 level with radiculopathy: Secondary | ICD-10-CM | POA: Diagnosis not present

## 2023-02-16 DIAGNOSIS — M9907 Segmental and somatic dysfunction of upper extremity: Secondary | ICD-10-CM | POA: Diagnosis not present

## 2023-02-20 ENCOUNTER — Other Ambulatory Visit (HOSPITAL_BASED_OUTPATIENT_CLINIC_OR_DEPARTMENT_OTHER): Payer: Self-pay

## 2023-02-20 MED ORDER — ROSUVASTATIN CALCIUM 20 MG PO TABS
20.0000 mg | ORAL_TABLET | Freq: Every day | ORAL | 3 refills | Status: AC
  Filled 2023-02-20: qty 30, 30d supply, fill #0

## 2023-02-20 MED ORDER — IRBESARTAN 300 MG PO TABS
ORAL_TABLET | ORAL | 3 refills | Status: AC
  Filled 2023-02-20: qty 30, 30d supply, fill #0

## 2023-02-20 MED ORDER — MOUNJARO 10 MG/0.5ML ~~LOC~~ SOAJ
SUBCUTANEOUS | 5 refills | Status: AC
  Filled 2023-03-03: qty 2, fill #0
  Filled 2023-03-04: qty 2, 28d supply, fill #0

## 2023-02-20 MED ORDER — TAMSULOSIN HCL 0.4 MG PO CAPS
0.4000 mg | ORAL_CAPSULE | Freq: Two times a day (BID) | ORAL | 3 refills | Status: AC
  Filled 2023-02-20: qty 60, 30d supply, fill #0

## 2023-02-20 MED ORDER — MONTELUKAST SODIUM 10 MG PO TABS
ORAL_TABLET | ORAL | 3 refills | Status: AC
  Filled 2023-02-20: qty 30, 30d supply, fill #0

## 2023-02-20 MED ORDER — BYSTOLIC 5 MG PO TABS
ORAL_TABLET | ORAL | 3 refills | Status: AC
  Filled 2023-02-20: qty 45, 90d supply, fill #0

## 2023-02-20 MED ORDER — FINASTERIDE 5 MG PO TABS
ORAL_TABLET | ORAL | 3 refills | Status: AC
  Filled 2023-02-20: qty 30, 30d supply, fill #0

## 2023-02-20 MED ORDER — EZETIMIBE 10 MG PO TABS
10.0000 mg | ORAL_TABLET | Freq: Every day | ORAL | 3 refills | Status: AC
  Filled 2023-02-20: qty 30, 30d supply, fill #0

## 2023-02-23 DIAGNOSIS — M9902 Segmental and somatic dysfunction of thoracic region: Secondary | ICD-10-CM | POA: Diagnosis not present

## 2023-02-23 DIAGNOSIS — M50122 Cervical disc disorder at C5-C6 level with radiculopathy: Secondary | ICD-10-CM | POA: Diagnosis not present

## 2023-02-23 DIAGNOSIS — M9907 Segmental and somatic dysfunction of upper extremity: Secondary | ICD-10-CM | POA: Diagnosis not present

## 2023-02-23 DIAGNOSIS — M9901 Segmental and somatic dysfunction of cervical region: Secondary | ICD-10-CM | POA: Diagnosis not present

## 2023-02-23 DIAGNOSIS — M50322 Other cervical disc degeneration at C5-C6 level: Secondary | ICD-10-CM | POA: Diagnosis not present

## 2023-03-02 ENCOUNTER — Encounter: Payer: Self-pay | Admitting: Neurology

## 2023-03-03 ENCOUNTER — Other Ambulatory Visit (HOSPITAL_BASED_OUTPATIENT_CLINIC_OR_DEPARTMENT_OTHER): Payer: Self-pay

## 2023-03-04 ENCOUNTER — Other Ambulatory Visit (HOSPITAL_BASED_OUTPATIENT_CLINIC_OR_DEPARTMENT_OTHER): Payer: Self-pay

## 2023-03-04 DIAGNOSIS — M542 Cervicalgia: Secondary | ICD-10-CM | POA: Diagnosis not present

## 2023-03-04 DIAGNOSIS — Z6827 Body mass index (BMI) 27.0-27.9, adult: Secondary | ICD-10-CM | POA: Diagnosis not present

## 2023-03-06 ENCOUNTER — Other Ambulatory Visit (HOSPITAL_BASED_OUTPATIENT_CLINIC_OR_DEPARTMENT_OTHER): Payer: Self-pay

## 2023-03-06 DIAGNOSIS — M50322 Other cervical disc degeneration at C5-C6 level: Secondary | ICD-10-CM | POA: Diagnosis not present

## 2023-03-06 DIAGNOSIS — M50122 Cervical disc disorder at C5-C6 level with radiculopathy: Secondary | ICD-10-CM | POA: Diagnosis not present

## 2023-03-06 DIAGNOSIS — M9902 Segmental and somatic dysfunction of thoracic region: Secondary | ICD-10-CM | POA: Diagnosis not present

## 2023-03-06 DIAGNOSIS — M9901 Segmental and somatic dysfunction of cervical region: Secondary | ICD-10-CM | POA: Diagnosis not present

## 2023-03-06 DIAGNOSIS — M9907 Segmental and somatic dysfunction of upper extremity: Secondary | ICD-10-CM | POA: Diagnosis not present

## 2023-03-09 ENCOUNTER — Other Ambulatory Visit (HOSPITAL_BASED_OUTPATIENT_CLINIC_OR_DEPARTMENT_OTHER): Payer: Self-pay

## 2023-03-09 ENCOUNTER — Other Ambulatory Visit: Payer: Self-pay

## 2023-03-10 ENCOUNTER — Other Ambulatory Visit: Payer: Self-pay

## 2023-03-10 ENCOUNTER — Encounter (HOSPITAL_BASED_OUTPATIENT_CLINIC_OR_DEPARTMENT_OTHER): Payer: Self-pay

## 2023-03-10 ENCOUNTER — Other Ambulatory Visit (HOSPITAL_BASED_OUTPATIENT_CLINIC_OR_DEPARTMENT_OTHER): Payer: Self-pay

## 2023-03-10 DIAGNOSIS — M9902 Segmental and somatic dysfunction of thoracic region: Secondary | ICD-10-CM | POA: Diagnosis not present

## 2023-03-10 DIAGNOSIS — M50322 Other cervical disc degeneration at C5-C6 level: Secondary | ICD-10-CM | POA: Diagnosis not present

## 2023-03-10 DIAGNOSIS — M50122 Cervical disc disorder at C5-C6 level with radiculopathy: Secondary | ICD-10-CM | POA: Diagnosis not present

## 2023-03-10 DIAGNOSIS — M9901 Segmental and somatic dysfunction of cervical region: Secondary | ICD-10-CM | POA: Diagnosis not present

## 2023-03-10 DIAGNOSIS — M9907 Segmental and somatic dysfunction of upper extremity: Secondary | ICD-10-CM | POA: Diagnosis not present

## 2023-03-18 DIAGNOSIS — M9902 Segmental and somatic dysfunction of thoracic region: Secondary | ICD-10-CM | POA: Diagnosis not present

## 2023-03-18 DIAGNOSIS — M9907 Segmental and somatic dysfunction of upper extremity: Secondary | ICD-10-CM | POA: Diagnosis not present

## 2023-03-18 DIAGNOSIS — M9901 Segmental and somatic dysfunction of cervical region: Secondary | ICD-10-CM | POA: Diagnosis not present

## 2023-03-18 DIAGNOSIS — M50122 Cervical disc disorder at C5-C6 level with radiculopathy: Secondary | ICD-10-CM | POA: Diagnosis not present

## 2023-03-18 DIAGNOSIS — M50322 Other cervical disc degeneration at C5-C6 level: Secondary | ICD-10-CM | POA: Diagnosis not present

## 2023-03-25 ENCOUNTER — Institutional Professional Consult (permissible substitution): Payer: BC Managed Care – PPO | Admitting: Neurology

## 2023-04-06 DIAGNOSIS — M9907 Segmental and somatic dysfunction of upper extremity: Secondary | ICD-10-CM | POA: Diagnosis not present

## 2023-04-06 DIAGNOSIS — M9902 Segmental and somatic dysfunction of thoracic region: Secondary | ICD-10-CM | POA: Diagnosis not present

## 2023-04-06 DIAGNOSIS — M50322 Other cervical disc degeneration at C5-C6 level: Secondary | ICD-10-CM | POA: Diagnosis not present

## 2023-04-06 DIAGNOSIS — M9903 Segmental and somatic dysfunction of lumbar region: Secondary | ICD-10-CM | POA: Diagnosis not present

## 2023-04-06 DIAGNOSIS — M9901 Segmental and somatic dysfunction of cervical region: Secondary | ICD-10-CM | POA: Diagnosis not present

## 2023-04-06 DIAGNOSIS — M50122 Cervical disc disorder at C5-C6 level with radiculopathy: Secondary | ICD-10-CM | POA: Diagnosis not present

## 2023-04-14 DIAGNOSIS — Z961 Presence of intraocular lens: Secondary | ICD-10-CM | POA: Diagnosis not present

## 2023-04-20 DIAGNOSIS — M9907 Segmental and somatic dysfunction of upper extremity: Secondary | ICD-10-CM | POA: Diagnosis not present

## 2023-04-20 DIAGNOSIS — M50122 Cervical disc disorder at C5-C6 level with radiculopathy: Secondary | ICD-10-CM | POA: Diagnosis not present

## 2023-04-20 DIAGNOSIS — M9902 Segmental and somatic dysfunction of thoracic region: Secondary | ICD-10-CM | POA: Diagnosis not present

## 2023-04-20 DIAGNOSIS — M50322 Other cervical disc degeneration at C5-C6 level: Secondary | ICD-10-CM | POA: Diagnosis not present

## 2023-04-20 DIAGNOSIS — M9901 Segmental and somatic dysfunction of cervical region: Secondary | ICD-10-CM | POA: Diagnosis not present

## 2023-04-20 DIAGNOSIS — M9903 Segmental and somatic dysfunction of lumbar region: Secondary | ICD-10-CM | POA: Diagnosis not present

## 2023-04-22 DIAGNOSIS — M50322 Other cervical disc degeneration at C5-C6 level: Secondary | ICD-10-CM | POA: Diagnosis not present

## 2023-04-22 DIAGNOSIS — M9902 Segmental and somatic dysfunction of thoracic region: Secondary | ICD-10-CM | POA: Diagnosis not present

## 2023-04-22 DIAGNOSIS — M9903 Segmental and somatic dysfunction of lumbar region: Secondary | ICD-10-CM | POA: Diagnosis not present

## 2023-04-22 DIAGNOSIS — M9907 Segmental and somatic dysfunction of upper extremity: Secondary | ICD-10-CM | POA: Diagnosis not present

## 2023-04-22 DIAGNOSIS — M9901 Segmental and somatic dysfunction of cervical region: Secondary | ICD-10-CM | POA: Diagnosis not present

## 2023-04-22 DIAGNOSIS — M50122 Cervical disc disorder at C5-C6 level with radiculopathy: Secondary | ICD-10-CM | POA: Diagnosis not present

## 2023-04-23 ENCOUNTER — Institutional Professional Consult (permissible substitution): Payer: BC Managed Care – PPO | Admitting: Neurology

## 2023-04-28 DIAGNOSIS — M9907 Segmental and somatic dysfunction of upper extremity: Secondary | ICD-10-CM | POA: Diagnosis not present

## 2023-04-28 DIAGNOSIS — M9902 Segmental and somatic dysfunction of thoracic region: Secondary | ICD-10-CM | POA: Diagnosis not present

## 2023-04-28 DIAGNOSIS — M50322 Other cervical disc degeneration at C5-C6 level: Secondary | ICD-10-CM | POA: Diagnosis not present

## 2023-04-28 DIAGNOSIS — M9901 Segmental and somatic dysfunction of cervical region: Secondary | ICD-10-CM | POA: Diagnosis not present

## 2023-04-28 DIAGNOSIS — M50122 Cervical disc disorder at C5-C6 level with radiculopathy: Secondary | ICD-10-CM | POA: Diagnosis not present

## 2023-05-04 DIAGNOSIS — M50322 Other cervical disc degeneration at C5-C6 level: Secondary | ICD-10-CM | POA: Diagnosis not present

## 2023-05-04 DIAGNOSIS — M9902 Segmental and somatic dysfunction of thoracic region: Secondary | ICD-10-CM | POA: Diagnosis not present

## 2023-05-04 DIAGNOSIS — M50122 Cervical disc disorder at C5-C6 level with radiculopathy: Secondary | ICD-10-CM | POA: Diagnosis not present

## 2023-05-04 DIAGNOSIS — M9907 Segmental and somatic dysfunction of upper extremity: Secondary | ICD-10-CM | POA: Diagnosis not present

## 2023-05-04 DIAGNOSIS — M9901 Segmental and somatic dysfunction of cervical region: Secondary | ICD-10-CM | POA: Diagnosis not present

## 2023-05-08 DIAGNOSIS — M9901 Segmental and somatic dysfunction of cervical region: Secondary | ICD-10-CM | POA: Diagnosis not present

## 2023-05-08 DIAGNOSIS — M9907 Segmental and somatic dysfunction of upper extremity: Secondary | ICD-10-CM | POA: Diagnosis not present

## 2023-05-08 DIAGNOSIS — M9903 Segmental and somatic dysfunction of lumbar region: Secondary | ICD-10-CM | POA: Diagnosis not present

## 2023-05-08 DIAGNOSIS — M50122 Cervical disc disorder at C5-C6 level with radiculopathy: Secondary | ICD-10-CM | POA: Diagnosis not present

## 2023-05-08 DIAGNOSIS — M50322 Other cervical disc degeneration at C5-C6 level: Secondary | ICD-10-CM | POA: Diagnosis not present

## 2023-05-08 DIAGNOSIS — M9902 Segmental and somatic dysfunction of thoracic region: Secondary | ICD-10-CM | POA: Diagnosis not present

## 2023-05-11 DIAGNOSIS — M9903 Segmental and somatic dysfunction of lumbar region: Secondary | ICD-10-CM | POA: Diagnosis not present

## 2023-05-11 DIAGNOSIS — M50322 Other cervical disc degeneration at C5-C6 level: Secondary | ICD-10-CM | POA: Diagnosis not present

## 2023-05-11 DIAGNOSIS — M50122 Cervical disc disorder at C5-C6 level with radiculopathy: Secondary | ICD-10-CM | POA: Diagnosis not present

## 2023-05-11 DIAGNOSIS — M9901 Segmental and somatic dysfunction of cervical region: Secondary | ICD-10-CM | POA: Diagnosis not present

## 2023-05-11 DIAGNOSIS — M9902 Segmental and somatic dysfunction of thoracic region: Secondary | ICD-10-CM | POA: Diagnosis not present

## 2023-05-11 DIAGNOSIS — M9907 Segmental and somatic dysfunction of upper extremity: Secondary | ICD-10-CM | POA: Diagnosis not present

## 2023-05-19 DIAGNOSIS — M9907 Segmental and somatic dysfunction of upper extremity: Secondary | ICD-10-CM | POA: Diagnosis not present

## 2023-05-19 DIAGNOSIS — M50122 Cervical disc disorder at C5-C6 level with radiculopathy: Secondary | ICD-10-CM | POA: Diagnosis not present

## 2023-05-19 DIAGNOSIS — M9901 Segmental and somatic dysfunction of cervical region: Secondary | ICD-10-CM | POA: Diagnosis not present

## 2023-05-19 DIAGNOSIS — M9903 Segmental and somatic dysfunction of lumbar region: Secondary | ICD-10-CM | POA: Diagnosis not present

## 2023-05-19 DIAGNOSIS — M50322 Other cervical disc degeneration at C5-C6 level: Secondary | ICD-10-CM | POA: Diagnosis not present

## 2023-05-19 DIAGNOSIS — M9902 Segmental and somatic dysfunction of thoracic region: Secondary | ICD-10-CM | POA: Diagnosis not present

## 2023-05-20 DIAGNOSIS — L570 Actinic keratosis: Secondary | ICD-10-CM | POA: Diagnosis not present

## 2023-05-20 DIAGNOSIS — Z85828 Personal history of other malignant neoplasm of skin: Secondary | ICD-10-CM | POA: Diagnosis not present

## 2023-05-20 DIAGNOSIS — L814 Other melanin hyperpigmentation: Secondary | ICD-10-CM | POA: Diagnosis not present

## 2023-05-25 DIAGNOSIS — M50122 Cervical disc disorder at C5-C6 level with radiculopathy: Secondary | ICD-10-CM | POA: Diagnosis not present

## 2023-05-25 DIAGNOSIS — M9907 Segmental and somatic dysfunction of upper extremity: Secondary | ICD-10-CM | POA: Diagnosis not present

## 2023-05-25 DIAGNOSIS — M9902 Segmental and somatic dysfunction of thoracic region: Secondary | ICD-10-CM | POA: Diagnosis not present

## 2023-05-25 DIAGNOSIS — M9903 Segmental and somatic dysfunction of lumbar region: Secondary | ICD-10-CM | POA: Diagnosis not present

## 2023-05-25 DIAGNOSIS — M50322 Other cervical disc degeneration at C5-C6 level: Secondary | ICD-10-CM | POA: Diagnosis not present

## 2023-05-25 DIAGNOSIS — M9901 Segmental and somatic dysfunction of cervical region: Secondary | ICD-10-CM | POA: Diagnosis not present

## 2023-05-29 DIAGNOSIS — M50322 Other cervical disc degeneration at C5-C6 level: Secondary | ICD-10-CM | POA: Diagnosis not present

## 2023-05-29 DIAGNOSIS — M9901 Segmental and somatic dysfunction of cervical region: Secondary | ICD-10-CM | POA: Diagnosis not present

## 2023-05-29 DIAGNOSIS — M9907 Segmental and somatic dysfunction of upper extremity: Secondary | ICD-10-CM | POA: Diagnosis not present

## 2023-05-29 DIAGNOSIS — M9902 Segmental and somatic dysfunction of thoracic region: Secondary | ICD-10-CM | POA: Diagnosis not present

## 2023-05-29 DIAGNOSIS — M50122 Cervical disc disorder at C5-C6 level with radiculopathy: Secondary | ICD-10-CM | POA: Diagnosis not present

## 2023-05-29 DIAGNOSIS — M9903 Segmental and somatic dysfunction of lumbar region: Secondary | ICD-10-CM | POA: Diagnosis not present

## 2023-06-04 DIAGNOSIS — M9903 Segmental and somatic dysfunction of lumbar region: Secondary | ICD-10-CM | POA: Diagnosis not present

## 2023-06-04 DIAGNOSIS — M9907 Segmental and somatic dysfunction of upper extremity: Secondary | ICD-10-CM | POA: Diagnosis not present

## 2023-06-04 DIAGNOSIS — M9902 Segmental and somatic dysfunction of thoracic region: Secondary | ICD-10-CM | POA: Diagnosis not present

## 2023-06-04 DIAGNOSIS — M9901 Segmental and somatic dysfunction of cervical region: Secondary | ICD-10-CM | POA: Diagnosis not present

## 2023-06-04 DIAGNOSIS — M50322 Other cervical disc degeneration at C5-C6 level: Secondary | ICD-10-CM | POA: Diagnosis not present

## 2023-06-04 DIAGNOSIS — M50122 Cervical disc disorder at C5-C6 level with radiculopathy: Secondary | ICD-10-CM | POA: Diagnosis not present

## 2023-07-10 DIAGNOSIS — M50122 Cervical disc disorder at C5-C6 level with radiculopathy: Secondary | ICD-10-CM | POA: Diagnosis not present

## 2023-07-10 DIAGNOSIS — M9901 Segmental and somatic dysfunction of cervical region: Secondary | ICD-10-CM | POA: Diagnosis not present

## 2023-07-10 DIAGNOSIS — M9903 Segmental and somatic dysfunction of lumbar region: Secondary | ICD-10-CM | POA: Diagnosis not present

## 2023-07-10 DIAGNOSIS — M9902 Segmental and somatic dysfunction of thoracic region: Secondary | ICD-10-CM | POA: Diagnosis not present

## 2023-07-10 DIAGNOSIS — M50322 Other cervical disc degeneration at C5-C6 level: Secondary | ICD-10-CM | POA: Diagnosis not present

## 2023-07-14 ENCOUNTER — Institutional Professional Consult (permissible substitution): Payer: BC Managed Care – PPO | Admitting: Neurology

## 2023-07-15 DIAGNOSIS — M50322 Other cervical disc degeneration at C5-C6 level: Secondary | ICD-10-CM | POA: Diagnosis not present

## 2023-07-15 DIAGNOSIS — M50122 Cervical disc disorder at C5-C6 level with radiculopathy: Secondary | ICD-10-CM | POA: Diagnosis not present

## 2023-07-15 DIAGNOSIS — M9902 Segmental and somatic dysfunction of thoracic region: Secondary | ICD-10-CM | POA: Diagnosis not present

## 2023-07-15 DIAGNOSIS — M9907 Segmental and somatic dysfunction of upper extremity: Secondary | ICD-10-CM | POA: Diagnosis not present

## 2023-07-15 DIAGNOSIS — M9903 Segmental and somatic dysfunction of lumbar region: Secondary | ICD-10-CM | POA: Diagnosis not present

## 2023-07-15 DIAGNOSIS — M9901 Segmental and somatic dysfunction of cervical region: Secondary | ICD-10-CM | POA: Diagnosis not present

## 2023-07-20 DIAGNOSIS — M50322 Other cervical disc degeneration at C5-C6 level: Secondary | ICD-10-CM | POA: Diagnosis not present

## 2023-07-20 DIAGNOSIS — R351 Nocturia: Secondary | ICD-10-CM | POA: Diagnosis not present

## 2023-07-20 DIAGNOSIS — M9902 Segmental and somatic dysfunction of thoracic region: Secondary | ICD-10-CM | POA: Diagnosis not present

## 2023-07-20 DIAGNOSIS — M50122 Cervical disc disorder at C5-C6 level with radiculopathy: Secondary | ICD-10-CM | POA: Diagnosis not present

## 2023-07-20 DIAGNOSIS — N401 Enlarged prostate with lower urinary tract symptoms: Secondary | ICD-10-CM | POA: Diagnosis not present

## 2023-07-20 DIAGNOSIS — M9901 Segmental and somatic dysfunction of cervical region: Secondary | ICD-10-CM | POA: Diagnosis not present

## 2023-07-20 DIAGNOSIS — M9907 Segmental and somatic dysfunction of upper extremity: Secondary | ICD-10-CM | POA: Diagnosis not present

## 2023-07-20 DIAGNOSIS — M9903 Segmental and somatic dysfunction of lumbar region: Secondary | ICD-10-CM | POA: Diagnosis not present

## 2023-07-29 ENCOUNTER — Institutional Professional Consult (permissible substitution): Payer: BC Managed Care – PPO | Admitting: Neurology

## 2023-07-29 ENCOUNTER — Telehealth: Payer: Self-pay | Admitting: Neurology

## 2023-07-29 NOTE — Telephone Encounter (Signed)
Pt LVM to cancelled appt for today

## 2023-08-07 DIAGNOSIS — R351 Nocturia: Secondary | ICD-10-CM | POA: Diagnosis not present

## 2023-08-07 DIAGNOSIS — R972 Elevated prostate specific antigen [PSA]: Secondary | ICD-10-CM | POA: Diagnosis not present

## 2023-08-07 DIAGNOSIS — N401 Enlarged prostate with lower urinary tract symptoms: Secondary | ICD-10-CM | POA: Diagnosis not present

## 2023-11-03 DIAGNOSIS — L821 Other seborrheic keratosis: Secondary | ICD-10-CM | POA: Diagnosis not present

## 2023-11-03 DIAGNOSIS — L570 Actinic keratosis: Secondary | ICD-10-CM | POA: Diagnosis not present

## 2023-11-03 DIAGNOSIS — L814 Other melanin hyperpigmentation: Secondary | ICD-10-CM | POA: Diagnosis not present

## 2023-11-03 DIAGNOSIS — Z85828 Personal history of other malignant neoplasm of skin: Secondary | ICD-10-CM | POA: Diagnosis not present

## 2023-11-03 DIAGNOSIS — D2372 Other benign neoplasm of skin of left lower limb, including hip: Secondary | ICD-10-CM | POA: Diagnosis not present

## 2024-01-25 DIAGNOSIS — R7301 Impaired fasting glucose: Secondary | ICD-10-CM | POA: Diagnosis not present

## 2024-01-25 DIAGNOSIS — E785 Hyperlipidemia, unspecified: Secondary | ICD-10-CM | POA: Diagnosis not present

## 2024-01-25 DIAGNOSIS — R972 Elevated prostate specific antigen [PSA]: Secondary | ICD-10-CM | POA: Diagnosis not present

## 2024-02-01 DIAGNOSIS — I10 Essential (primary) hypertension: Secondary | ICD-10-CM | POA: Diagnosis not present

## 2024-02-01 DIAGNOSIS — R82998 Other abnormal findings in urine: Secondary | ICD-10-CM | POA: Diagnosis not present

## 2024-02-01 DIAGNOSIS — Z0000 Encounter for general adult medical examination without abnormal findings: Secondary | ICD-10-CM | POA: Diagnosis not present

## 2024-06-03 DIAGNOSIS — H26491 Other secondary cataract, right eye: Secondary | ICD-10-CM | POA: Diagnosis not present

## 2024-07-25 DIAGNOSIS — D485 Neoplasm of uncertain behavior of skin: Secondary | ICD-10-CM | POA: Diagnosis not present

## 2024-07-25 DIAGNOSIS — L570 Actinic keratosis: Secondary | ICD-10-CM | POA: Diagnosis not present

## 2024-08-17 DIAGNOSIS — M1811 Unilateral primary osteoarthritis of first carpometacarpal joint, right hand: Secondary | ICD-10-CM | POA: Diagnosis not present
